# Patient Record
Sex: Female | Born: 1970 | Race: White | Hispanic: No | Marital: Married | Smoking: Never smoker
Health system: Southern US, Community
[De-identification: ages and names within clinical notes are randomized; demographics above are authoritative.]

## PROBLEM LIST (undated history)

## (undated) DIAGNOSIS — N200 Calculus of kidney: Secondary | ICD-10-CM

## (undated) HISTORY — PX: ABDOMINAL HYSTERECTOMY: SHX81

## (undated) HISTORY — PX: MASTECTOMY: SHX3

---

## 2007-01-12 ENCOUNTER — Inpatient Hospital Stay (HOSPITAL_COMMUNITY): Admission: AD | Admit: 2007-01-12 | Discharge: 2007-01-12 | Payer: Self-pay | Admitting: Obstetrics and Gynecology

## 2007-01-13 ENCOUNTER — Inpatient Hospital Stay (HOSPITAL_COMMUNITY): Admission: AD | Admit: 2007-01-13 | Discharge: 2007-01-13 | Payer: Self-pay | Admitting: *Deleted

## 2007-02-08 ENCOUNTER — Encounter (INDEPENDENT_AMBULATORY_CARE_PROVIDER_SITE_OTHER): Payer: Self-pay | Admitting: Obstetrics and Gynecology

## 2007-02-08 ENCOUNTER — Inpatient Hospital Stay (HOSPITAL_COMMUNITY): Admission: AD | Admit: 2007-02-08 | Discharge: 2007-02-13 | Payer: Self-pay | Admitting: Obstetrics and Gynecology

## 2007-02-10 ENCOUNTER — Encounter: Payer: Self-pay | Admitting: Obstetrics and Gynecology

## 2007-02-14 ENCOUNTER — Encounter: Admission: RE | Admit: 2007-02-14 | Discharge: 2007-02-19 | Payer: Self-pay | Admitting: Obstetrics and Gynecology

## 2008-08-15 ENCOUNTER — Encounter: Admission: RE | Admit: 2008-08-15 | Discharge: 2008-08-15 | Payer: Self-pay | Admitting: Obstetrics and Gynecology

## 2009-08-21 ENCOUNTER — Encounter: Admission: RE | Admit: 2009-08-21 | Discharge: 2009-08-21 | Payer: Self-pay | Admitting: Obstetrics and Gynecology

## 2010-08-11 ENCOUNTER — Other Ambulatory Visit: Payer: Self-pay | Admitting: Obstetrics and Gynecology

## 2010-08-11 DIAGNOSIS — Z0000 Encounter for general adult medical examination without abnormal findings: Secondary | ICD-10-CM

## 2010-08-22 ENCOUNTER — Ambulatory Visit
Admission: RE | Admit: 2010-08-22 | Discharge: 2010-08-22 | Disposition: A | Discharge status: Home / Self Care | Payer: Managed Care, Other (non HMO) | Source: Ambulatory Visit | Attending: Obstetrics and Gynecology | Admitting: Obstetrics and Gynecology

## 2010-08-22 DIAGNOSIS — Z0000 Encounter for general adult medical examination without abnormal findings: Secondary | ICD-10-CM

## 2010-12-04 NOTE — Op Note (Signed)
NAME:  Aimee Mcdaniel, Aimee Mcdaniel               ACCOUNT NO.:  [id number]   MEDICAL RECORD NO.:  [id number]          PATIENT TYPE:  INP   LOCATION:  9372                          FACILITY:  [facility]   PHYSICIAN:  Lenoard Aden, M.D.DATE OF BIRTH:  May 18, 1971   DATE OF PROCEDURE:  02/08/2007  DATE OF DISCHARGE:                               OPERATIVE REPORT   PREOPERATIVE DIAGNOSIS:  Twin B breech with foot and cord presenting.   POSTOPERATIVE DIAGNOSIS:  Twin B breech with foot and cord presenting.   PROCEDURES:  1. Urgent primary low segment transverse cesarean section.  2. Failed external cephalic version.   SURGEON:  Lenoard Aden, M.D.   ASSISTANT:  Marlinda Mike, C.N.M.   ANESTHESIA:  Epidural by Raul Del, M.D.   ESTIMATED BLOOD LOSS:  500 mL.   COMPLICATIONS:  None.   DRAINS:  Foley.   COUNTS:  Correct.   Patient to recovery in good condition.   BRIEF OPERATIVE NOTE:  After uncomplicated vacuum-assisted delivery of  twin A, it is noted that twin B is presenting with feet cord.  External  cephalic version is attempted x2 with forward roll and backward roll  done under ultrasound guidance without success.  Fetal heart tones  remaining in the 100-120 beat per minute range during the entire  process.  After two attempts at external cephalic version, the decision  was made to proceed with primary C-section due to foot and cord  presenting.  At this time the patient was brought to the operating  where, she is administered dosing of epidural anesthetic without  complication prepped and draped in the usual sterile fashion.  A Foley  catheter had been previously placed.  At this time a Pfannenstiel skin  incision is then made with a scalpel, carried down to the fascia, which  was nicked in the midline and opened transversely using Mayo scissors.  Rectus muscles dissected sharply in the midline, peritoneum entered  sharply.  Bladder blade placed.  Visceral peritoneum scored  sharply off  the lower uterine segment.  Kerr hysterotomy incision made.  Atraumatic  delivery of twin B from a footling breech position.  Was handed to  pediatricians in attendance.  Apgars pending.  Cord pH 7.04.  Placenta  was delivered manually intact, three-vessel cords are noted.  The  placenta is sent to pathology for confirmation of diochorionicity.  At  this time the uterus is curetted using a dry lap pack and closed in two  running, imbricating  layers of 0 Monocryl suture.  Good hemostasis is noted.  Bladder flap  inspected and found to be hemostatic.  Irrigation is accomplished.  Fascia closed using 0 Monocryl in continuous running fashion.  Skin  closed using staples.  The patient tolerates the procedure well and is  transferred to recovery in good condition.      Lenoard Aden, M.D.  Electronically Signed     RJT/MEDQ  D:  02/08/2007  T:  02/08/2007  Job:  [id number]

## 2010-12-04 NOTE — Op Note (Signed)
NAME:  Aimee Mcdaniel, Aimee Mcdaniel               ACCOUNT NO.:  [id number]   MEDICAL RECORD NO.:  [id number]          PATIENT TYPE:  INP   LOCATION:  9372                          [facility]ACILITY:  [facility]   PHYSICIAN:  Lenoard Aden, M.D.DATE OF BIRTH:  [birth year]   DATE OF PROCEDURE:  02/08/2007  DATE OF DISCHARGE:                               OPERATIVE REPORT   PREOPERATIVE DIAGNOSIS:  Nonreassuring fetal heart rate tracing Twin A.   POSTOPERATIVE DIAGNOSIS:  Nonreassuring fetal heart rate tracing Twin A.   PROCEDURE:  Outlet vacuum-assisted vaginal delivery with Kiwi cup.   SURGEON:  Taavon.   ANESTHESIA:  Epidural.   ESTIMATED BLOOD LOSS:  Less than 200 mL.   COMPLICATIONS:  None.   DRAINS:  Foley.   BRIEF OPERATIVE NOTE:  After being apprised of the risks and benefits of  vacuum assistance, including a small incidence of septal hematoma, scalp  laceration and intracranial hemorrhage, the patient Kiwi cup is placed,  fetal vertex OA, +3 station for two pulls over intact perineum a full-  term living female, Apgars 8 and 9, cord clamped.  No perineal laceration  noted, cervix intact.  Bulging bag of water noted for Twin B.   ESTIMATED BLOOD LOSS:  Less than 200 mL.   Separate note to be dictated for Twin B's delivery.      Lenoard Aden, M.D.  Electronically Signed     RJT/MEDQ  D:  02/08/2007  T:  02/08/2007  Job:  [id number]

## 2010-12-04 NOTE — Consult Note (Signed)
NAME:  Aimee Mcdaniel, Aimee Mcdaniel NO.:  [id number]   MEDICAL RECORD NO.:  [id number]          PATIENT TYPE:  OUT   LOCATION:  MRI                          FACILITY:  [facility]   PHYSICIAN:  Deanna Artis. Hickling, M.D.DATE OF BIRTH:  April 16, 1971   DATE OF CONSULTATION:  02/10/2007  DATE OF DISCHARGE:                                 CONSULTATION   CHIEF COMPLAINT:  Visual misperceptions and alteration with  hypertension.   I was asked to evaluate Aimee Mcdaniel.  She is a 40 year old gravida 4,  para 1-3-0-4 woman who delivered twin children,  boy and girl February 14, 2007.   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  Peak creatinine occurred on June 20 at 2205 and was 2.44.  It is since dropped to 1.99 as of this morning at 5:05.  The patient's  BUN was of 27.  Sodium slightly low 131.  The rest of the electrolytes  were normal and glucose was low at 5.3, calcium low at 7.55.  I presumed  that her albumin is similarly low (1.7).  Transaminase is at and 5:15  yesterday morning showed an AST of 234, ALT of 178.  The patient's  glomerular filtration rate was less than 60.  She was treated with  magnesium sulfate but developed elevated magnesium level up to 5.7 in  part because her renal status.  Uric acid was also elevated at 9.1.   The patient has had elevated white count of 18,600, normal platelets and  normal hemoglobin and MCV.   The patient has had in addition to her abnormal liver and renal  functions has had visual changes where she feels as if she is seeing  ants crawling around, she is seeing double or triple of her of objects  such as her husband's head. She seeing hair on things and looked at her  sheets and said it looked like lava was flowing.   The patient has not had headache.  Other than the visual changes, she  has been lucid and she realizes that these visual changes are not real.  She has had problems with unsteady gait and  blurred vision and problems  with memory.  In the setting, I was asked to see her.  We performed a CT  scan of the brain which was unremarkable.  We intended to perform an MRI  scan of the brain without and with contrast but could not do that  because the patient has staples from her C-section and also her  glomerular filtration rate is such that she could not have received  contrast.  I had intended to perform MRI scan of the brain without and  with contrast and an MRP to rule out venous sinus thrombosis.   PAST MEDICAL HISTORY:  The patient gained about 31-1/2 pounds up to February 02, 2007.  Twins were discovered with the first ultrasound.  The  patient's blood pressure was normal in the 100-110 over 60-70 range  until the time of delivery when heart rate shot up.  I believe that the  twins were delivered at [redacted] weeks gestational age.  The patient had no  other significant medical problems and has not had previous problems  with her pregnancies.  Her first baby was [redacted] weeks gestational age, the  twins were [redacted] weeks gestational age, the second pregnancy was [redacted] weeks  gestational age.   There is no family history of seizures, mental retardation, blindness,  deafness, birth defects.  The patient's mother is taking no medications  at this time and has no known allergies to medicines.   REVIEW OF SYSTEMS:  Is as noted above. In addition she has had problems  with gastroesophageal reflux disease, constipation.  She wears contacts.  She says that she has a spot in her eyes which I was not able to see on  funduscopic examination.  She had prior vaginal deliveries in 1998 and  2001, and had a cesarean delivery for the twins.   The twins were conceived by in vitro fertilization.  Mother is O+,  antibody negative, rubella immune, hepatitis surface antigen, VDRL and  GC, chlamydia, group B strep negative.   PHYSICAL EXAMINATION:  GENERAL:  On examination today, pleasant well-  developed woman no  acute distress.  VITAL SIGNS:  Blood pressure 149/98, resting pulse 80, respirations 18,  temperature 99.4, oxygen saturation 97% on room air.  HEENT:  Supple neck.  No infections.  No bruits.  LUNGS:  Clear.  HEART:  No murmurs.  Pulses normal.  ABDOMEN:  Soft.  She is post gravid protuberant, tender.  Bowel sounds  are normal.  EXTREMITIES:  Were normal except for pitting edema in the ankles.  NEUROLOGY:  Awake, alert.  She is not having hallucinations rather she  is having visual misperceptions and distortions.  She is aware that  these were not real.  CRANIAL NERVES:  Round reactive pupils.  Visual fields full to double  simultaneous stimuli.  Fundi showed irregular disk margins which were  sharp, normal vessels.  No exudates or hemorrhages. She had normal  macular regions bilaterally.  Motor examination; normal strength, tone,  and mass.  Good fine motor movements.  No pronator drift.  Sensation is  intact cold, vibration, proprioception, stereognosis.  Cerebellar  examination good finger-to-nose, rapid repetitive movements.  Gait not  tested. Deep tendon reflexes were normal.  The patient had bilateral  flexor plantar responses.   IMPRESSION:  1. 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 (348.39)  2. Hypertension.  3. Visual dysfunction.   PLAN:  1. Labetalol 100 mg twice daily.  2. Discussed this with Doretha Sou, M.D. of NICU.  The amount      of labetalol that will get into breast milk is not enough to a      significantly effect the children.  We will monitor the patient's      liver and renal functions.  MRI scan is not possible until staples      comes out, MRV does not require contrast.  The patient cannot      receive Gadolinium until glomerular filtration rate (GFR) is      greater than 60.  I appreciate the opportunity to participate in      her care, and I believe that this will improve over days to  weeks.      If you have questions or I can be of assistance do not hesitate to      contact me.  Deanna Artis. Sharene Skeans, M.D.  Electronically Signed     [facility]/MEDQ  D:  02/10/2007  T:  02/11/2007  Job:  [id number]   cc:   Lenoard Aden, M.D.  Fax: [phone]

## 2010-12-07 NOTE — Discharge Summary (Signed)
NAME:  Aimee Mcdaniel, HEADEN               ACCOUNT NO.:  [id number]   MEDICAL RECORD NO.:  [id number]          PATIENT TYPE:  INP   LOCATION:  9108                          FACILITY:  [facility]   PHYSICIAN:  Lenoard Aden, M.D.DATE OF BIRTH:  April 14, 1971   DATE OF ADMISSION:  02/08/2007  DATE OF DISCHARGE:  02/13/2007                               DISCHARGE SUMMARY   Patient with an uncomplicated vaginal delivery followed by a primary C  section for twin intrauterine gestation. Postoperative course  complicated by preeclampsia with poor toleration to elevated blood  pressure. Neurology consult obtained. Started on medication as noted by  neurology. She is started on labetalol. She is discharged to home day 5.  Discharge teaching done. Followup in the office in 1 week.      Lenoard Aden, M.D.  Electronically Signed     RJT/MEDQ  D:  03/25/2007  T:  03/26/2007  Job:  [id number]

## 2011-05-02 ENCOUNTER — Ambulatory Visit: Payer: Managed Care, Other (non HMO) | Attending: Gynecologic Oncology | Admitting: Gynecologic Oncology

## 2011-05-02 DIAGNOSIS — Z803 Family history of malignant neoplasm of breast: Secondary | ICD-10-CM | POA: Insufficient documentation

## 2011-05-02 DIAGNOSIS — N9489 Other specified conditions associated with female genital organs and menstrual cycle: Secondary | ICD-10-CM | POA: Insufficient documentation

## 2011-05-03 NOTE — Consult Note (Signed)
NAME:  Aimee Mcdaniel, Aimee Mcdaniel NO.:  [id number]  MEDICAL RECORD NO.:  [id number]  LOCATION:  GYN                          FACILITY:  [facility]  PHYSICIAN:  Laurette Schimke, MD     DATE OF BIRTH:  [birth year]  DATE OF CONSULTATION:  05/02/2011 DATE OF DISCHARGE:                                CONSULTATION   REQUESTING PHYSICIAN:  Alphonsus Sias. Ernestina Penna, M.D.  REASON FOR VISIT:  Bilateral adnexal masses.  Consult requested by Dr. Noland Fordyce for evaluation of pelvic masses.  HISTORY OF PRESENT ILLNESS:  This is a 40 year old gravida 3, para 3, who in February 2012, noted severe abdominal pain when out of town on a winter vacation.  At that time, an ultrasound was performed and was consistent with a cyst, it was presumed that perhaps she had had an intermittent ovarian torsion.  An ultrasound was obtained in April 2012 and that ultrasound demonstrated a right ovary with 3 simple cysts, the largest measuring 3.3 cm.  The left ovary had the appearance of an endometrioma, measuring 2.5 cm.  Repeat ultrasound 6 months later in September, 2012 demonstrates an increase in the right ovarian size with a change in volume from 51 cc to 111 cc.  The adnexa now measures 5.5 cm in greatest dimension.  The left ovary is notable to have a stable 2.5 cm x 2 cm endometrioma.  A CA-125 was ordered and a value returned as 21.  Aimee Mcdaniel denies any dysmenorrhea, dyspareunia, abnormal uterine bleeding.  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  Her husband has since had a vasectomy.  PAST SURGICAL HISTORY:  Cesarean section.  FAMILY HISTORY:  Paternal grandmother with breast cancer diagnosed in her 50s.  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  She denies tobacco use and reports occasional social alcohol use.  REVIEW OF SYSTEMS:  No nausea, vomiting, fever, or chills.  No  abnormal uterine bleeding.  Dysmenorrhea.  No rectal bleeding or hematuria. Otherwise, 10-point review of systems is unremarkable.  PHYSICAL EXAMINATION:  GENERAL:  A well-developed female, in no acute distress. VITAL SIGNS:  Weight 160 pounds, height 5 feet 6 inches, blood pressure 100/58, pulse of 76. CHEST:  Clear to auscultation. LYMPH NODE SURVEY:  No cervical, supraclavicular, or inguinal adenopathy. ABDOMEN:  Soft and nontender without any masses. PELVIC:  Normal external genitalia, Bartholin's, urethra, and Skene. Cervix small.  Nabothian cysts identified.  Uterus is mobile, no nodularity noted within the cul-de-sac.  Good anal sphincter tone without any rectal masses.  IMPRESSION AND PLAN:  Aimee Mcdaniel is a 40 year old with a normal CA-125 and enlarging adnexal masses consistent with an endometrioma.  At this time, she is asymptomatic, but is concerned regarding the growth. I have recommended to Aimee Mcdaniel the following that Dr. Ernestina Penna proceed with robotic-assisted bilateral ovarian cystectomies with strong consideration for performing right salpingo-oophorectomy particularly in the ovary with 3 cysts increasing in size.  If there is evidence of significant endometriosis, that warrants removal of both ovaries.  Aimee Mcdaniel at that time would prefer that a hysterectomy be  obtained.  This, however, will be based on this discussion with Dr. Ernestina Penna and Aimee Mcdaniel.  Aimee Mcdaniel is aware if malignancy is easily evident at the time of surgery, Dr. Ernestina Penna would likely just perform a biopsy to confirm the diagnosis and she would be re-referred to our service for definitive surgical management.  A similar plan has been discussed in the event that final pathology is consistent with an occult cancer.  Aimee Mcdaniel agrees and wishes to proceed.  I have advised her that if it is her intent to have this procedure occurred before Thanksgiving, it would be more prudent to contact Dr.  Ernestina Penna sooner rather than later.  Thank you very much for allowing me to participate in the care of this patient.     Laurette Schimke, MD     WB/MEDQ  D:  05/02/2011  T:  05/03/2011  Job:  [id number]  cc:   Telford Nab, R.N. [address] [place], [place] [address]  Lendon Colonel, MD Fax: [phone]  Electronically Signed by Laurette Schimke MD on 05/03/2011 12:58:07 PM

## 2011-05-06 LAB — CBC
HCT: 33.5 — ABNORMAL LOW
HCT: 34.2 — ABNORMAL LOW
HCT: 39
HCT: 39.5
Hemoglobin: 11.3 — ABNORMAL LOW
Hemoglobin: 11.5 — ABNORMAL LOW
Hemoglobin: 13.1
MCHC: 32.9
MCHC: 33.1
MCHC: 33.4
MCHC: 34.3
MCV: 88.6
MCV: 90
MCV: 90.2
MCV: 90.2
Platelets: 197
Platelets: 227
Platelets: 230
Platelets: 282
RBC: 3.78 — ABNORMAL LOW
RBC: 3.79 — ABNORMAL LOW
RBC: 4.14
RDW: 14.5 — ABNORMAL HIGH
RDW: 15.5 — ABNORMAL HIGH
WBC: 12.4 — ABNORMAL HIGH
WBC: 16.2 — ABNORMAL HIGH
WBC: 18.6 — ABNORMAL HIGH

## 2011-05-06 LAB — COMPREHENSIVE METABOLIC PANEL
ALT: 178 — ABNORMAL HIGH
ALT: 71 — ABNORMAL HIGH
AST: 234 — ABNORMAL HIGH
AST: 472 — ABNORMAL HIGH
Albumin: 1.7 — ABNORMAL LOW
Albumin: 1.9 — ABNORMAL LOW
Albumin: 1.9 — ABNORMAL LOW
Albumin: 2.5 — ABNORMAL LOW
Alkaline Phosphatase: 282 — ABNORMAL HIGH
BUN: 17
BUN: 25 — ABNORMAL HIGH
BUN: 26 — ABNORMAL HIGH
CO2: 23
CO2: 24
Calcium: 10.2
Calcium: 8.2 — ABNORMAL LOW
Calcium: 8.5
Calcium: 9.1
Chloride: 104
Chloride: 104
Creatinine, Ser: 2 — ABNORMAL HIGH
Creatinine, Ser: 2.28 — ABNORMAL HIGH
GFR calc Af Amer: 30 — ABNORMAL LOW
GFR calc Af Amer: 34 — ABNORMAL LOW
GFR calc non Af Amer: 25 — ABNORMAL LOW
GFR calc non Af Amer: >60
Glucose, Bld: 119 — ABNORMAL HIGH
Glucose, Bld: 95
Potassium: 3.8
Potassium: 4.2
Sodium: 132 — ABNORMAL LOW
Sodium: 136
Total Bilirubin: 2.6 — ABNORMAL HIGH
Total Bilirubin: 4 — ABNORMAL HIGH
Total Protein: 5 — ABNORMAL LOW
Total Protein: 5.2 — ABNORMAL LOW
Total Protein: 6

## 2011-05-06 LAB — RAPID HIV SCREEN (WH-MAU): Rapid HIV Screen: NONREACTIVE

## 2011-05-06 LAB — BASIC METABOLIC PANEL
CO2: 23
CO2: 25
Calcium: 8.2 — ABNORMAL LOW
Calcium: 8.5
Chloride: 104
Chloride: 110
Creatinine, Ser: 1.99 — ABNORMAL HIGH
Creatinine, Ser: 2.44 — ABNORMAL HIGH
GFR calc Af Amer: 27 — ABNORMAL LOW
GFR calc Af Amer: 34 — ABNORMAL LOW
GFR calc Af Amer: >60
GFR calc non Af Amer: 22 — ABNORMAL LOW
Glucose, Bld: 64 — ABNORMAL LOW
Potassium: 4.5
Potassium: 4.8
Sodium: 131 — ABNORMAL LOW
Sodium: 139

## 2011-05-06 LAB — URIC ACID
Uric Acid, Serum: 9.1 — ABNORMAL HIGH
Uric Acid, Serum: 9.2 — ABNORMAL HIGH
Uric Acid, Serum: 9.2 — ABNORMAL HIGH
Uric Acid, Serum: 9.6 — ABNORMAL HIGH

## 2011-05-06 LAB — PROTIME-INR: Prothrombin Time: 14.7

## 2011-05-06 LAB — MAGNESIUM: Magnesium: 5.4 — ABNORMAL HIGH

## 2011-06-12 ENCOUNTER — Other Ambulatory Visit (HOSPITAL_COMMUNITY): Payer: Managed Care, Other (non HMO)

## 2011-06-20 ENCOUNTER — Ambulatory Visit (HOSPITAL_COMMUNITY)
Admission: RE | Admit: 2011-06-20 | Payer: Managed Care, Other (non HMO) | Source: Ambulatory Visit | Admitting: Obstetrics

## 2011-06-20 ENCOUNTER — Encounter (HOSPITAL_COMMUNITY): Admission: RE | Payer: Self-pay | Source: Ambulatory Visit

## 2011-06-20 SURGERY — ROBOTIC ASSISTED LAPAROSCOPIC OVARIAN CYSTECTOMY
Anesthesia: General | Laterality: Right

## 2011-07-24 ENCOUNTER — Other Ambulatory Visit: Payer: Self-pay | Admitting: Obstetrics

## 2011-07-24 DIAGNOSIS — Z1231 Encounter for screening mammogram for malignant neoplasm of breast: Secondary | ICD-10-CM

## 2011-08-26 ENCOUNTER — Ambulatory Visit
Admission: RE | Admit: 2011-08-26 | Discharge: 2011-08-26 | Disposition: A | Discharge status: Home / Self Care | Payer: Managed Care, Other (non HMO) | Source: Ambulatory Visit | Attending: Obstetrics | Admitting: Obstetrics

## 2011-08-26 ENCOUNTER — Ambulatory Visit: Payer: Managed Care, Other (non HMO)

## 2011-08-26 DIAGNOSIS — Z1231 Encounter for screening mammogram for malignant neoplasm of breast: Secondary | ICD-10-CM

## 2012-03-25 ENCOUNTER — Other Ambulatory Visit: Payer: Self-pay

## 2012-08-10 ENCOUNTER — Other Ambulatory Visit: Payer: Self-pay | Admitting: Obstetrics

## 2012-08-10 DIAGNOSIS — Z1231 Encounter for screening mammogram for malignant neoplasm of breast: Secondary | ICD-10-CM

## 2012-08-28 ENCOUNTER — Ambulatory Visit
Admission: RE | Admit: 2012-08-28 | Discharge: 2012-08-28 | Disposition: A | Discharge status: Home / Self Care | Payer: Managed Care, Other (non HMO) | Source: Ambulatory Visit | Attending: Obstetrics | Admitting: Obstetrics

## 2012-08-28 DIAGNOSIS — Z1231 Encounter for screening mammogram for malignant neoplasm of breast: Secondary | ICD-10-CM

## 2013-04-05 ENCOUNTER — Other Ambulatory Visit: Payer: Self-pay | Admitting: Obstetrics

## 2013-04-05 DIAGNOSIS — N644 Mastodynia: Secondary | ICD-10-CM

## 2013-04-19 ENCOUNTER — Other Ambulatory Visit: Payer: Self-pay | Admitting: Obstetrics

## 2013-04-19 ENCOUNTER — Ambulatory Visit
Admission: RE | Admit: 2013-04-19 | Discharge: 2013-04-19 | Disposition: A | Discharge status: Home / Self Care | Payer: Managed Care, Other (non HMO) | Source: Ambulatory Visit | Attending: Obstetrics | Admitting: Obstetrics

## 2013-04-19 DIAGNOSIS — N644 Mastodynia: Secondary | ICD-10-CM

## 2013-08-12 ENCOUNTER — Other Ambulatory Visit: Payer: Self-pay

## 2013-08-12 DIAGNOSIS — Z1231 Encounter for screening mammogram for malignant neoplasm of breast: Secondary | ICD-10-CM

## 2013-09-01 ENCOUNTER — Ambulatory Visit: Payer: Managed Care, Other (non HMO)

## 2013-09-03 ENCOUNTER — Ambulatory Visit: Payer: Managed Care, Other (non HMO)

## 2013-09-15 ENCOUNTER — Ambulatory Visit
Admission: RE | Admit: 2013-09-15 | Discharge: 2013-09-15 | Disposition: A | Discharge status: Home / Self Care | Payer: Managed Care, Other (non HMO) | Source: Ambulatory Visit

## 2013-09-15 DIAGNOSIS — Z1231 Encounter for screening mammogram for malignant neoplasm of breast: Secondary | ICD-10-CM

## 2014-08-02 ENCOUNTER — Other Ambulatory Visit: Payer: Self-pay

## 2014-08-02 DIAGNOSIS — Z1231 Encounter for screening mammogram for malignant neoplasm of breast: Secondary | ICD-10-CM

## 2014-09-19 ENCOUNTER — Ambulatory Visit
Admission: RE | Admit: 2014-09-19 | Discharge: 2014-09-19 | Disposition: A | Discharge status: Home / Self Care | Payer: 59 | Source: Ambulatory Visit

## 2014-09-19 ENCOUNTER — Ambulatory Visit: Payer: Managed Care, Other (non HMO)

## 2014-09-19 DIAGNOSIS — Z1231 Encounter for screening mammogram for malignant neoplasm of breast: Secondary | ICD-10-CM

## 2015-01-16 ENCOUNTER — Encounter: Payer: Self-pay | Admitting: Gynecologic Oncology

## 2015-01-16 ENCOUNTER — Other Ambulatory Visit: Payer: Self-pay | Admitting: Obstetrics

## 2015-01-16 ENCOUNTER — Ambulatory Visit: Payer: 59 | Attending: Gynecologic Oncology | Admitting: Gynecologic Oncology

## 2015-01-16 VITALS — BP 123/63 | HR 68 | Temp 98.0°F | Resp 22 | Wt 184.3 lb

## 2015-01-16 DIAGNOSIS — N8320 Unspecified ovarian cysts: Secondary | ICD-10-CM

## 2015-01-16 DIAGNOSIS — N83209 Unspecified ovarian cyst, unspecified side: Secondary | ICD-10-CM

## 2015-01-16 DIAGNOSIS — Z803 Family history of malignant neoplasm of breast: Secondary | ICD-10-CM

## 2015-01-16 NOTE — Patient Instructions (Signed)
[facility] will contact you with an Surgical appt.

## 2015-01-16 NOTE — Progress Notes (Signed)
Consult Note: Gyn-Onc  Consult was requested by Dr. Pamala Hurry for the evaluation of Aimee Mcdaniel 44 y.o. female with BRCA 1 deleterious mutation and mildly elevated CA 125 in association with bilateral ovarian cysts.  CC:  Chief Complaint  Patient presents with  . Ovarian Cyst    Assessment/Plan:  Ms. Aimee Mcdaniel  is a 44 y.o.  year old woman with a deleterious mutation in BRCA 1 and bilateral complex ovarian cysts and a mildly elevated CA 125.    I discussed with Aimee Mcdaniel that I believe these ovarian masses are most likely benign however given her underlying familial predisposition to ovarian cancer, in the setting of a limited CA-125, I recommend surgical removal, frozen section, and staging or debulking as appropriate. She will prefer to undergo surgery at [facility] and we have facilitated this for her. We have scheduled her for a robotic-assisted total hysterectomy, BSO, possible staging.  I had an extensive discussion with Aimee Mcdaniel regarding options for surgery if the mass is a benign. We discussed the role of hysterectomy in risk reduction for ovarian and fallopian tube cancer and discussed that this does not carry with it a defined improvement in outcomes. We discussed that it does increase surgical risk and recovery. I did discuss that it does simplify post operative hormone replacement therapy (with Estrogen only preparations). The patient appears to be at average risk for future endometrial cancers, and has only a remote history of cervical dysplasia. After hearing this counseling Aimee Mcdaniel has elected for hysterectomy at the time of her BSO regardless of ovarian pathology.  We also had a discussion regarding implications of finding malignancy the time of frozen section. We discussed surgical staging which would include omentectomy, lymph node dissection, and biopsies. We will obtain a preoperative CT scan of the abdomen and pelvis to better evaluate the upper abdomen given her BRCA1 mutation  status, elevated CA-125, and complex adnexal masses. The finding of upper abdominal disease would suggest ovarian cancer and would likely necessitate a laparotomy approach for surgery. We discussed surgical risk including  bleeding, infection, damage to internal organs (such as bladder,ureters, bowels), blood clot, reoperation and rehospitalization. We discussed anticipated length of stay and postoperative recovery.  HPI: Aimee Mcdaniel is a very pleasant 44 year old para 4 who is seen in consultation at the request of Dr. Pamala Hurry for bilateral complex ovarian cysts and a deleterious mutation in BRCA1. The patient has a history of having pelvic pain approximate 5 years ago which prompted her gynecologist to perform pelvic ultrasound. At this time ovarian cysts were identified bilaterally. She subsequently underwent annual surveillance with ultrasounds of the cysts, which she reports came and went, but overall remained somewhat stable. Her most recent ultrasound was performed this see her on 12/13/2014. It revealed a grossly normal appearing uterus with measurements of 10 x 6.2 x 4.7 cm, an endometrial thickness of 12.9 mm. The right ovary was enlarged with a complex cyst 2 measuring 3.9 x 3.5 x 4.3 cm, and a 1.9 x 1.6 x 1.8 cm cyst. Both of these appear to be either endometriomas or hemorrhagic cysts. There was no vascularity noted within the cysts. The left ovary contained a complex cyst with low level echoes throughout measuring 3.5 x 2.5 x 2.9 cm, and was also consistent with an endometrioma. No free fluid was identified.  A CA-125 was drawn on 12/29/2014 and was slightly elevated at 38.  While the patient was sitting in her OB/GYN's office she noticed a flyer advertising genetic testing for BRCA  deleterious mutations. She has a family history of a paternal grandmother with breast cancer, and reports knowing that on her father's side of the family there was the "cancer gene". Despite this family history  she has no other close relatives with a defined malignancy other than her paternal grandmother. She electively underwent brachy mutation testing by the [facility] [employer] [facility]. It revealed a deleterious mutation in BRCA1.  She is not yet seen the high risk clinic for breast care, however she does have an MRI of the breast scheduled for next month.  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  Her only prior abdominal surgery is a cesarean section performed emergently for the second twin. She has a history of cervical dysplasia approximately 15-20 years ago that was treated with cryotherapy. She has had close regular surveillance for cervical dysplasia since that time and has had no subsequent abnormal pap smears.  Current Meds:  Outpatient Encounter Prescriptions as of 01/16/2015  Medication Sig  . buPROPion (WELLBUTRIN SR) 100 MG 12 hr tablet TK 1 T PO BID  . Vitamin D, Ergocalciferol, (DRISDOL) 50000 UNITS CAPS capsule TK 1 C PO WEEKLY   No facility-administered encounter medications on file as of 01/16/2015.    Allergy: Not on File  Social Hx:   History   Social History  . Marital Status: Married    Spouse Name: N/A  . Number of Children: N/A  . Years of Education: N/A   Occupational History  . Not on file.   Social History Main Topics  . Smoking status: Never Smoker   . Smokeless tobacco: Not on file  . Alcohol Use: 1.8 oz/week    3 Glasses of wine per week     Comment: per week  . Drug Use: Not on file  . Sexual Activity: Yes   Other Topics Concern  . Not on file   Social History Narrative  . No narrative on file    Past Surgical Hx: History reviewed. No pertinent past surgical history.  Past Medical Hx: History reviewed. No pertinent past medical history.  Past Gynecological History:  See above, SVD x 3 and 1 c/s. Remote hx of  cervical cryo for dysplasia. BRCA 1 deleterious mutation. No LMP recorded.  Family Hx: History reviewed. No pertinent family history.  Review of Systems:  Constitutional  Feels well,   ENT Normal appearing ears and nares bilaterally Skin/Breast  No rash, sores, jaundice, itching, dryness Cardiovascular  No chest pain, shortness of breath, or edema  Pulmonary  No cough or wheeze.  Gastro Intestinal  No nausea, vomitting, or diarrhoea. No bright red blood per rectum, no abdominal pain, change in bowel movement, or constipation.  Genito Urinary  No frequency, urgency, dysuria, Musculo Skeletal  No myalgia, arthralgia, joint swelling or pain  Neurologic  No weakness, numbness, change in gait,  Psychology  No depression, anxiety, insomnia.   Vitals:  Blood pressure 123/63, pulse 68, temperature 98 F (36.7 C), temperature source Oral, resp. rate 22, weight 184 lb 4.8 oz (83.598 kg), SpO2 100 %.  Physical Exam: WD in NAD Neck  Supple NROM, without any enlargements.  Lymph Node Survey No cervical supraclavicular or inguinal adenopathy Cardiovascular  Pulse normal rate, regularity and rhythm. S1 and S2 normal.  Lungs  Clear to auscultation bilateraly, without wheezes/crackles/rhonchi. Good air movement.  Skin  No rash/lesions/breakdown  Psychiatry  Alert and oriented to person, place, and time  Abdomen  Normoactive bowel sounds, abdomen soft, non-tender and overweight without evidence of hernia.  Back No CVA tenderness Genito Urinary  Vulva/vagina: Normal external female genitalia.  No lesions. No discharge or bleeding.  Bladder/urethra:  No lesions or masses, well supported bladder  Vagina: normal  Cervix: Normal appearing, no lesions.  Uterus: Small, mobile, no parametrial involvement or nodularity.  Adnexa: no palpable masses. Rectal  Good tone, no masses no cul de sac nodularity.  Extremities  No bilateral cyanosis, clubbing or edema.   Donaciano Eva,  MD   01/16/2015, 1:05 PM

## 2015-01-19 ENCOUNTER — Ambulatory Visit (HOSPITAL_COMMUNITY)
Admission: RE | Admit: 2015-01-19 | Discharge: 2015-01-19 | Disposition: A | Discharge status: Home / Self Care | Payer: Managed Care, Other (non HMO) | Source: Ambulatory Visit | Attending: Gynecologic Oncology | Admitting: Gynecologic Oncology

## 2015-01-19 ENCOUNTER — Ambulatory Visit (HOSPITAL_COMMUNITY): Payer: 59

## 2015-01-19 DIAGNOSIS — R971 Elevated cancer antigen 125 [CA 125]: Secondary | ICD-10-CM | POA: Diagnosis not present

## 2015-01-19 DIAGNOSIS — N8320 Unspecified ovarian cysts: Secondary | ICD-10-CM | POA: Diagnosis present

## 2015-01-19 DIAGNOSIS — N83209 Unspecified ovarian cyst, unspecified side: Secondary | ICD-10-CM

## 2015-01-19 MED ORDER — IOHEXOL 300 MG/ML  SOLN
100.0000 mL | Freq: Once | INTRAMUSCULAR | Status: AC | PRN
  Administered 2015-01-19: 100 mL via INTRAVENOUS

## 2015-01-20 ENCOUNTER — Telehealth: Payer: Self-pay | Admitting: Gynecologic Oncology

## 2015-01-20 NOTE — Telephone Encounter (Signed)
Patient informed of CT findings.  No concerns voiced.  Advised to call for any questions or concerns.

## 2015-01-20 NOTE — Telephone Encounter (Signed)
-----   Message from Adolphus BirchwoodEmma Rossi, MD sent at 01/20/2015  9:56 AM EDT ----- Can we let Aimee CalicoFrances know that the CT did not show anything more concerning. This may still be benign ovarian cysts. No change to current plan. Kara MeadEmma

## 2015-01-24 ENCOUNTER — Telehealth: Payer: Self-pay | Admitting: Nurse Practitioner

## 2015-01-24 DIAGNOSIS — N9489 Other specified conditions associated with female genital organs and menstrual cycle: Secondary | ICD-10-CM | POA: Insufficient documentation

## 2015-01-24 DIAGNOSIS — N83209 Unspecified ovarian cyst, unspecified side: Secondary | ICD-10-CM | POA: Insufficient documentation

## 2015-01-24 NOTE — Telephone Encounter (Signed)
[facility] gyn onc team calling to request most recent MD note. Dr. Oliver Humossi's progress note 01/16/15 faxed to [phone] per request.

## 2015-02-10 ENCOUNTER — Inpatient Hospital Stay: Admission: RE | Admit: 2015-02-10 | Payer: 59 | Source: Ambulatory Visit

## 2015-02-11 ENCOUNTER — Ambulatory Visit
Admission: RE | Admit: 2015-02-11 | Discharge: 2015-02-11 | Disposition: A | Discharge status: Home / Self Care | Payer: 59 | Source: Ambulatory Visit | Attending: Obstetrics | Admitting: Obstetrics

## 2015-02-11 MED ORDER — GADOBENATE DIMEGLUMINE 529 MG/ML IV SOLN
17.0000 mL | Freq: Once | INTRAVENOUS | Status: AC | PRN
  Administered 2015-02-11: 17 mL via INTRAVENOUS

## 2015-02-28 ENCOUNTER — Other Ambulatory Visit: Payer: Self-pay | Admitting: Obstetrics and Gynecology

## 2015-02-28 DIAGNOSIS — R928 Other abnormal and inconclusive findings on diagnostic imaging of breast: Secondary | ICD-10-CM

## 2015-04-17 ENCOUNTER — Ambulatory Visit: Payer: Self-pay | Admitting: Surgery

## 2015-04-17 NOTE — H&P (Signed)
Aimee Mcdaniel 04/17/2015 9:16 AM Location: [facility] Patient #: [id number] DOB: [birth year] Married / Language: Aimee Mcdaniel / Race: White Female History of Present Illness Aimee Mcdaniel A. Micael Barb MD; 04/17/2015 12:19 PM) Patient words: BRCA + Pt sent at the request of Dr Migdalia Aimee Mcdaniel for positive BRCA test and desire for bilateral mastectomy and reconstruction. She purchased her own test and was positive. She has had BSO /HYSTERCTOMY and desires bilateral mastectomy for further risk reduction. 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 She has no other complaints.                      CLINICAL DATA: 44 year old female with newly diagnosed BRCA 1 mutation. The patient had a negative screening mammogram in February 2016.  LABS: No labs were performed at the imaging center today.  EXAM: BILATERAL BREAST MRI WITH AND WITHOUT CONTRAST  TECHNIQUE: Multiplanar, multisequence MR images of both breasts were obtained prior to and following the intravenous administration of 17 ml of MultiHance.  THREE-DIMENSIONAL MR IMAGE RENDERING ON INDEPENDENT WORKSTATION:  Three-dimensional MR images were rendered by post-processing of the original MR data on an independent workstation. The three-dimensional MR images were interpreted, and findings are reported in the following complete MRI report for this study. Three dimensional images were evaluated at the independent DynaCad workstation  COMPARISON: Prior mammograms, the most recent dated 09/19/2014  FINDINGS: Breast composition: c. Heterogeneous fibroglandular tissue.  Background parenchymal enhancement: Mild  Right breast: Within the upper, outer right breast, there is an irregular enhancing mass measuring 1.1 (AP) x 1 (TR) x 0.7 (CC) cm (series 7, image 43). Multiple patchy areas of non mass enhancement are noted within the posterior right breast.  Left breast: Within the upper,  outer left breast, there is a large area of asymmetric non mass enhancement spanning 5.4 (AP) x 2.2 (TR) x 4.7 (CC) cm. There is an additional smaller area of asymmetric non mass enhancement within the lower, outer left breast (see series 7, image 100).  Lymph nodes: No abnormal appearing lymph nodes.  IMPRESSION: 1. Indeterminate irregular enhancing mass within the upper, outer right breast. 2. Indeterminate asymmetric area of non mass enhancement within the upper, outer left breast. Additional smaller area of similar appearing non mass enhancement within the lower, outer left breast.  RECOMMENDATION: MRI guided biopsies of the irregular mass within the upper, outer right breast and the area of asymmetric non mass enhancement within the upper, outer left breast. If biopsy results demonstrate atypia or malignancy, MRI guided biopsy of the additional area of asymmetric non mass enhancement within the lower, outer left breast is recommended. If biopsy results are benign, six-month follow-up breast MRI is recommended.  BI-RADS CATEGORY 4: Suspicious.   Electronically Signed By: Pamelia Hoit M.D. On: 02/13/2015 13:42.  The patient is a 44 year old female   Other Problems Aimee Mcdaniel, [place]; 04/17/2015 9:17 AM) Cancer Other disease, cancer, significant illness  Past Surgical History Aimee Mcdaniel, [place]; 04/17/2015 9:17 AM) Breast Biopsy Bilateral. Cesarean Section - 1 Hysterectomy (not due to cancer) - Complete  Diagnostic Studies History Aimee Mcdaniel, [place]; 04/17/2015 9:17 AM) Colonoscopy 5-10 years ago Mammogram within last year Pap Smear 1-5 years ago  Allergies Aimee Mcdaniel, CMA; 04/17/2015 9:17 AM) No Known Drug Allergies 04/17/2015  Medication History Aimee Mcdaniel, CMA; 04/17/2015 9:18 AM) Vitamin D (50000U Tablet, Oral) Active. Medications Reconciled  Social History Aimee Mcdaniel,  CMA; 04/17/2015 9:17 AM) Alcohol use  Moderate alcohol use. Caffeine use Coffee. No drug use Tobacco use Never smoker.  Family History Aimee Mcdaniel, [place]; 04/17/2015 9:17 AM) Heart disease in female family member before age 42  Pregnancy / Birth History Aimee Mcdaniel, [place]; 04/17/2015 9:17 AM) Age at menarche 59 years. Age of menopause <43 Gravida 3 Maternal age 77-35 Para 4 Regular periods     Review of Systems Aimee Mcdaniel CMA; 04/17/2015 9:17 AM) General Present- Night Sweats and Weight Gain. Not Present- Appetite Loss, Chills, Fatigue, Fever and Weight Loss. Skin Not Present- Change in Wart/Mole, Dryness, Hives, Jaundice, New Lesions, Non-Healing Wounds, Rash and Ulcer. HEENT Not Present- Earache, Hearing Loss, Hoarseness, Nose Bleed, Oral Ulcers, Ringing in the Ears, Seasonal Allergies, Sinus Pain, Sore Throat, Visual Disturbances, Wears glasses/contact lenses and Yellow Eyes. Respiratory Not Present- Bloody sputum, Chronic Cough, Difficulty Breathing, Snoring and Wheezing. Breast Not Present- Breast Mass, Breast Pain, Nipple Discharge and Skin Changes. Cardiovascular Not Present- Chest Pain, Difficulty Breathing Lying Down, Leg Cramps, Palpitations, Rapid Heart Rate, Shortness of Breath and Swelling of Extremities. Gastrointestinal Present- Change in Bowel Habits and Excessive gas. Not Present- Abdominal Pain, Bloating, Bloody Stool, Chronic diarrhea, Constipation, Difficulty Swallowing, Gets full quickly at meals, Hemorrhoids, Indigestion, Nausea, Rectal Pain and Vomiting. Female Genitourinary Not Present- Frequency, Nocturia, Painful Urination, Pelvic Pain and Urgency. Musculoskeletal Not Present- Back Pain, Joint Pain, Joint Stiffness, Muscle Pain, Muscle Weakness and Swelling of Extremities. Neurological Not Present- Decreased Memory, Fainting, Headaches, Numbness, Seizures, Tingling, Tremor, Trouble walking and Weakness. Psychiatric Not Present- Anxiety, Bipolar, Change in Sleep Pattern,  Depression, Fearful and Frequent crying. Endocrine Present- Hot flashes. Not Present- Cold Intolerance, Excessive Hunger, Hair Changes, Heat Intolerance and New Diabetes. Hematology Not Present- Easy Bruising, Excessive bleeding, Gland problems, HIV and Persistent Infections.  Vitals [employer] R. Mcdaniel CMA; 04/17/2015 9:17 AM) 04/17/2015 9:16 AM Weight: 191.5 lb Height: 66.5in Body Surface Area: 2.02 m Body Mass Index: 30.45 kg/m BP: 124/82 (Sitting, Left Arm, Standard)     Physical Exam (Jaspal Pultz A. Nekeisha Aure MD; 04/17/2015 12:20 PM)  General Mental Status-Alert. General Appearance-Consistent with stated age. Hydration-Well hydrated. Voice-Normal.  Head and Neck Head-normocephalic, atraumatic with no lesions or palpable masses. Trachea-midline. Thyroid Gland Characteristics - normal size and consistency.  Eye Eyeball - Bilateral-Extraocular movements intact. Sclera/Conjunctiva - Bilateral-No scleral icterus.  Chest and Lung Exam Chest and lung exam reveals -quiet, even and easy respiratory effort with no use of accessory muscles and on auscultation, normal breath sounds, no adventitious sounds and normal vocal resonance. Inspection Chest Wall - Normal. Back - normal.  Breast Note: large ptotic breasts no masses left larger than right   Cardiovascular Cardiovascular examination reveals -normal heart sounds, regular rate and rhythm with no murmurs and normal pedal pulses bilaterally.  Neurologic Neurologic evaluation reveals -alert and oriented x 3 with no impairment of recent or remote memory. Mental Status-Normal.  Musculoskeletal Normal Exam - Left-Upper Extremity Strength Normal and Lower Extremity Strength Normal. Normal Exam - Right-Upper Extremity Strength Normal and Lower Extremity Strength Normal.  Lymphatic Head & Neck  General Head & Neck Lymphatics: Bilateral - Description - Normal. Axillary  General Axillary Region:  Bilateral - Description - Normal. Tenderness - Non Tender.    Assessment & Plan (Amanee Iacovelli A. Kailena Lubas MD; 04/17/2015 12:21 PM)  BRCA1 POSITIVE (Z15.01) Impression: PT DESIRES BILATERAL SIMPLE MASTECTOMY FOR RISK REDUCTION. SHE IS NOT A CANDIDATE FOR NSM. Discussed treatment options for BRCA 1 OF bilateral mastectomy with  reconstruction. Pt has decided on mastectomy. Risk include bleeding, infection, flap necrosis, pain, numbness, recurrence, hematoma, other surgery needs. Pt understands and agrees to proceed. Dr Migdalia Aimee Mcdaniel to coordinate reconstruction.  Current Plans Pt Education - Overview of hereditary breast and ovarian cancer syndromes: discussed with patient and provided information. Pt Education - BRCA1 and BRCA2-associated hereditary breast and ovarian cancer: discussed with patient and provided information. Pt Education - CCS Mastectomy HCI   The anatomy and the physiology was discussed. The pathophysiology and natural history of the disease was discussed. Options were discussed and recommendations were made. Technique, risks, benefits, & alternatives were discussed. Risks such as stroke, heart attack, bleeding, indection, death, and other risks discussed. Questions answered. The patient agrees to proceed.

## 2015-04-27 ENCOUNTER — Telehealth: Payer: Self-pay | Admitting: Genetic Counselor

## 2015-04-27 NOTE — Telephone Encounter (Signed)
genetic appt-s/w patient and gave genetic appt for 10/011 @ 11 w/Kayla Boggs.

## 2015-05-02 ENCOUNTER — Ambulatory Visit (HOSPITAL_BASED_OUTPATIENT_CLINIC_OR_DEPARTMENT_OTHER): Payer: Managed Care, Other (non HMO) | Admitting: Genetic Counselor

## 2015-05-02 ENCOUNTER — Encounter: Payer: Self-pay | Admitting: Genetic Counselor

## 2015-05-02 ENCOUNTER — Other Ambulatory Visit: Payer: 59

## 2015-05-02 DIAGNOSIS — Z1501 Genetic susceptibility to malignant neoplasm of breast: Secondary | ICD-10-CM

## 2015-05-02 DIAGNOSIS — Z315 Encounter for genetic counseling: Secondary | ICD-10-CM

## 2015-05-02 DIAGNOSIS — Z803 Family history of malignant neoplasm of breast: Secondary | ICD-10-CM | POA: Diagnosis not present

## 2015-05-02 DIAGNOSIS — Z1379 Encounter for other screening for genetic and chromosomal anomalies: Secondary | ICD-10-CM

## 2015-05-02 DIAGNOSIS — Z1509 Genetic susceptibility to other malignant neoplasm: Principal | ICD-10-CM

## 2015-05-02 DIAGNOSIS — Z8041 Family history of malignant neoplasm of ovary: Secondary | ICD-10-CM | POA: Diagnosis not present

## 2015-05-02 NOTE — Progress Notes (Signed)
GENETIC TEST RESULTS   Patient Name: Aimee Mcdaniel Patient Age: 44 y.o. Encounter Date: 05/02/2015  Referring Provider: Aloha Gell, MD   Aimee Mcdaniel had previous positive BRCA1 genetic testing through [facility], [place]), at which time she had a 19-minute post-results counseling session with one of the [employer] genetic counselors via telephone.  She comes to [facility] today with her husband prior to a scheduled prophylactic bilateral mastectomies procedure scheduled for the end of the month at [facility], to have some additional questions and concerns addressed.      GENETIC TESTING:  Aimee Mcdaniel has no personal history of cancer.  She has a family history of breast cancer in her paternal grandmother that was diagnosed at the age of 40 or younger.  There is also a maternal family history of prostate cancer in her maternal grandfather, diagnosed at 30 and a history of ovarian cancer diagnosed in her maternal great grandmother (MGF's mother).    While she had no overwhelming family history of cancer, Aimee Mcdaniel reports that she was curious to know more about her own potential genetic cancer risks.  She cites this as the primary reason for her pursuing genetic testing.  However, she also recalled working with a distant relative who, at one time, informed her that their family--the Henrene Pastor family--"carried the BRCA gene".  She had a difficult time getting genetic testing covered by insurance and eventually had genetic testing through [facility], and this was ordered by Dr. Pamala Hurry.  This test was the 24-gene Inherited Cancer Screen panel, which included sequencing and/or deletion/duplication analysis of the APC, ATM, BMPR1A, BRCA1, BRCA2, CDH1, CHEK2, EPCAM, MEN1, MLH1, MSH2, MSH6, PALB2, PMS2, PTEN, RET, SDHA, SDHB, SDHC, SMAD4, STK11, TP53, and VHL genes.  Testing revealed a mutation in one copy of the BRCA1 gene called "[id number]".  No  additional pathogenic mutations were found.  Note that variant of uncertain significance (VUSs), if any, were not reported on this test report.  Date of report is December 27, 2014.    Aimee Mcdaniel reports that she has since spoken with one of her distant Henrene Pastor cousins.  This cousin took a picture of her previous positive genetic test result, and this showed that she was found to have the same BRCA1 mutation.  Thus, this mutation must have been inherited from Aimee Mcdaniel's father, which he must have inherited from his own father.   MEDICAL MANAGEMENT: Aimee Mcdaniel was very aware that women who have a BRCA mutation have an increased risk for both breast and ovarian cancer.  She underwent a TAH-BSO in August 2016 and plans to [facility] a prophylactic double mastectomy procedure at the end of the month at [facility].  She is aware of the screening recommendations for BRCA1 mutation carriers, as listed below:  Breast Cancer:  - Starting at age 44: Breast awareness - Women should be familiar with their breasts and promptly report changes to their healthcare provider. Performing regular breast self exams may help increase breast awareness, especially when checked at the end of the menstrual cycle.  - Starting at age 3: Clinical breast exam every 6-12 months. - Between ages 25-29 or individualized based on family history: Breast MRI screening (preferred) every year or mammogram if MRI is unavailable. - Between ages 31-75: Mammogram and breast MRI screening every year.  - Option of risk-reducing bilateral mastectomies  Ovarian Cancer: - Recommend risk-reducing salpingo-oophorectomy (RRSO), typically between 60 and 34 y, and upon completion of  child bearing.   - While there may be circumstances where ovarian cancer screening with transvaginal ultrasound and a blood test for a protein called CA-125 are helpful, these techniques have not been shown to be effective in detecting early ovarian cancer and are generally not  recommended.  - Consider risk reduction agents as options for breast and ovarian cancer, including discussing risks and benefits.  Other Cancers: Men with BRCA1 mutations also need to be aware of their breast and prostate cancer risks and screening recommendatons.  We discussed that while literature has shown other cancers to be associated with BRCA1 mutations, national guidelines do not currently recommend any specific screenings for these cancers. Screening may be individualized based on cancers observed the family  TODAY'S CONCERNS: While we did discuss that prophylactic bilateral mastectomies can reduce breast cancer risk by over 90%, Aimee Mcdaniel was already very well-read regarding BRCA1 cancer risks and screening recommendations.  Her main concerns today centered around ensuring that this was in fact a positive test result (and not a false positive) and about how she should go about telling her children, in particular her oldest daughter who will be 42 (and thus eligible for testing) soon.    We discussed that false positive results are rare, that the lab takes additional control measures to ensure that these results do not occur, that the family history of this same mutation makes a false positive even more unlikely, and we also double-checked that this is a pathogenic result through use of the [employer].  The ClinVar database demonstrated approximately 9 additional entries in which this was called a pathogenic mutation.    We discussed that Aimee Mcdaniel's daughters should definitely be informed of their 50% chance for having inherited this same mutation by the age of 80, since that is when they will begin clinical management.  Beyond that, we discussed that she and her husband are the best individuals to determine when the best time to disclose this information is.  I also presented Aimee Mcdaniel with a nice booklet from the [employer] which discusses how to talk about BRCA in your  family tree with children who are at risk.    FAMILY MEMBERS: We discussed that is important that all of Ms. Render's relatives (both men and women) know of the presence of this gene mutation. Ms. Digilio brother should also be informed of his 50% chance for having inherited this same mutation.  It is also important to attempt to reach out to more distant paternal relatives.  Site-specific genetic testing can sort out who in the family is at risk and who is not.   SUPPORT AND RESOURCES: If Ms. Rane is interested in BRCA-specific information and support, there are two groups, Facing Our Risk of Cancer Empowered (FORCE) (www.facingourrisk.com) and Bright Pink (www.brightpink.org) which some people have found useful. They provide opportunities to speak with other individuals from high-risk families. To locate genetic counselors in other cities, visit the website of the [employer] (ArtistMovie.se) and Secretary/administrator for a Social worker by zip code.  Additionally we discussed the interest meeting for a Hereditary Cancer Support Group we will be holding here at [facility] on November 7th at 6 PM.    We encouraged Ms. Haidar to remain in contact with [place] on an annual basis so we can update her personal and family histories, and let her know of advances in cancer genetics that may benefit the family. Our contact number was provided.  Ms. Belanger questions were answered to her satisfaction today, and she knows she is welcome to call anytime with additional questions.    Jeanine Luz, MS Genetic Counselor Phone: [phone] [email or web address]

## 2015-05-26 ENCOUNTER — Telehealth: Payer: Self-pay | Admitting: Genetic Counselor

## 2015-05-26 DIAGNOSIS — Z9013 Acquired absence of bilateral breasts and nipples: Secondary | ICD-10-CM | POA: Insufficient documentation

## 2015-06-02 NOTE — Telephone Encounter (Signed)
Called and left message about new support group [place], if interested call my office at [phone].

## 2015-06-08 ENCOUNTER — Ambulatory Visit (HOSPITAL_BASED_OUTPATIENT_CLINIC_OR_DEPARTMENT_OTHER): Admit: 2015-06-08 | Payer: 59 | Admitting: Plastic Surgery

## 2015-06-08 ENCOUNTER — Encounter (HOSPITAL_BASED_OUTPATIENT_CLINIC_OR_DEPARTMENT_OTHER): Payer: Self-pay

## 2015-06-08 SURGERY — BREAST RECONSTRUCTION WITH PLACEMENT OF TISSUE EXPANDER AND FLEX HD (ACELLULAR HYDRATED DERMIS)
Anesthesia: General | Site: Breast | Laterality: Bilateral

## 2015-07-16 ENCOUNTER — Emergency Department (HOSPITAL_COMMUNITY)
Admission: EM | Admit: 2015-07-16 | Discharge: 2015-07-16 | Disposition: A | Discharge status: Home / Self Care | Payer: Managed Care, Other (non HMO) | Attending: Emergency Medicine | Admitting: Emergency Medicine

## 2015-07-16 ENCOUNTER — Encounter (HOSPITAL_COMMUNITY): Payer: Self-pay | Admitting: Emergency Medicine

## 2015-07-16 ENCOUNTER — Emergency Department (HOSPITAL_COMMUNITY): Payer: Managed Care, Other (non HMO)

## 2015-07-16 DIAGNOSIS — Z79899 Other long term (current) drug therapy: Secondary | ICD-10-CM | POA: Insufficient documentation

## 2015-07-16 DIAGNOSIS — R319 Hematuria, unspecified: Secondary | ICD-10-CM | POA: Diagnosis present

## 2015-07-16 DIAGNOSIS — N200 Calculus of kidney: Secondary | ICD-10-CM | POA: Diagnosis not present

## 2015-07-16 DIAGNOSIS — Z90710 Acquired absence of both cervix and uterus: Secondary | ICD-10-CM | POA: Insufficient documentation

## 2015-07-16 DIAGNOSIS — R1031 Right lower quadrant pain: Secondary | ICD-10-CM

## 2015-07-16 LAB — URINALYSIS, ROUTINE W REFLEX MICROSCOPIC
BILIRUBIN URINE: NEGATIVE
Bilirubin Urine: NEGATIVE
Glucose, UA: NEGATIVE mg/dL
Glucose, UA: NEGATIVE mg/dL
Ketones, ur: NEGATIVE mg/dL
Ketones, ur: NEGATIVE mg/dL
Leukocytes, UA: NEGATIVE
NITRITE: NEGATIVE
Nitrite: NEGATIVE
PROTEIN: 30 mg/dL — AB
Protein, ur: NEGATIVE mg/dL
SPECIFIC GRAVITY, URINE: 1.019 (ref 1.005–1.030)
Specific Gravity, Urine: 1.005 (ref 1.005–1.030)
pH: 6 (ref 5.0–8.0)
pH: 7.5 (ref 5.0–8.0)

## 2015-07-16 LAB — URINE MICROSCOPIC-ADD ON

## 2015-07-16 LAB — I-STAT CHEM 8, ED
BUN: 14 mg/dL (ref 6–20)
CALCIUM ION: 1.26 mmol/L — AB (ref 1.12–1.23)
CHLORIDE: 104 mmol/L (ref 101–111)
Creatinine, Ser: 0.7 mg/dL (ref 0.44–1.00)
GLUCOSE: 102 mg/dL — AB (ref 65–99)
HCT: 40 % (ref 36.0–46.0)
Hemoglobin: 13.6 g/dL (ref 12.0–15.0)
Potassium: 3.5 mmol/L (ref 3.5–5.1)
SODIUM: 142 mmol/L (ref 135–145)
TCO2: 26 mmol/L (ref 0–100)

## 2015-07-16 MED ORDER — OXYCODONE-ACETAMINOPHEN 5-325 MG PO TABS: 1.0000 | ORAL_TABLET | ORAL | Status: DC | PRN

## 2015-07-16 MED ORDER — TAMSULOSIN HCL 0.4 MG PO CAPS: 0.4000 mg | ORAL_CAPSULE | Freq: Every day | ORAL | Status: DC

## 2015-07-16 NOTE — ED Notes (Signed)
Pt reports that on Tuesday her urine looked like tea stained, Wednesday urine appeared more like blood in it. Symptoms resolved.  Pt had a hysterectomy August 1st and recent sexual experience that brought on the symptoms again. Pt denies burning with urination.

## 2015-07-16 NOTE — ED Provider Notes (Signed)
CSN: [id number]     Arrival date & time 07/16/15  0756 History   First MD Initiated Contact with Patient 07/16/15 0801     Chief Complaint  Patient presents with  . Hematuria    HPI    44 for female presents today with complaints of hematuria. Patient reports that on Tuesday proximal 5 days ago she had an episode of what she described as tea stained urine. She attributed to post coitus bleeding as she hadn't been sexually active and extended period of time. She reports that after that episode she had clear urine and she denied any other complaints including abdominal pain, nausea, vomiting, vaginal discharge or bleeding. Patient reports that yesterday she experienced very mild right lower quadrant abdominal pain. This pain was not worsened by movement, palpation; described as dull. She reports that this morning she had brown urine (no clots) , with no worsening of abdominal pain. She continues to deny any nausea, vomiting, upper abdominal pain, vaginal bleeding or discharge. Patient reports that she is leaving for [facility] this evening and would like to be evaluated prior to her trip. She reports that she normally has some urinary frequency, no acute changes in that, no burning with urination, odorous urine. Patient denies any other complaints including fever, chills, nausea, vomiting. She does report a significant past medical history of BRCA1 mutation resulting in both elective mastectomy and hysterectomy.    History reviewed. No pertinent past medical history. Past Surgical History  Procedure Laterality Date  . Abdominal hysterectomy    . Mastectomy     Family History  Problem Relation Age of Onset  . Other Father     BRCA1 mutation + (based on known paternal FHx in distant cousins)  . Lupus Maternal Aunt   . Congestive Heart Failure Maternal Grandmother 56  . Prostate cancer Maternal Grandfather 80  . Breast cancer Paternal Grandmother     dx. 43 or younger  . Heart attack Paternal  Grandfather 57  . Other Paternal Grandfather     presumed carrier of BRCA1 mutation  . Ovarian cancer Other    Social History  Substance Use Topics  . Smoking status: Never Smoker   . Smokeless tobacco: Never Used  . Alcohol Use: 1.8 oz/week    3 Glasses of wine per week     Comment: per week   OB History    No data available     Review of Systems  All other systems reviewed and are negative.  Allergies  Review of patient's allergies indicates no known allergies.  Home Medications   Prior to Admission medications   Medication Sig Start Date End Date Taking? Authorizing Provider  buPROPion (WELLBUTRIN SR) 100 MG 12 hr tablet TK 1 T PO BID 01/09/15   Historical Provider, MD  oxyCODONE-acetaminophen (ROXICET) 5-325 MG tablet Take 1 tablet by mouth every 4 (four) hours as needed for severe pain. 07/16/15   Okey Regal, PA-C  tamsulosin (FLOMAX) 0.4 MG CAPS capsule Take 1 capsule (0.4 mg total) by mouth daily. 07/16/15   Okey Regal, PA-C  Vitamin D, Ergocalciferol, (DRISDOL) 50000 UNITS CAPS capsule TK 1 C PO WEEKLY 12/23/14   Historical Provider, MD   BP 126/79 mmHg  Pulse 78  Temp(Src) 97.1 F (36.2 C)  Resp 18  Ht '5\' 6"'$  (1.676 m)  Wt 83.915 kg  BMI 29.87 kg/m2  SpO2 98%  LMP 01/27/2015   Physical Exam  Constitutional: She is oriented to person, place, and time. She appears  well-developed and well-nourished.  HENT:  Head: Normocephalic and atraumatic.  Eyes: Conjunctivae are normal. Pupils are equal, round, and reactive to light. Right eye exhibits no discharge. Left eye exhibits no discharge. No scleral icterus.  Neck: Normal range of motion. No JVD present. No tracheal deviation present.  Pulmonary/Chest: Effort normal. No stridor.  Abdominal: Soft. She exhibits no distension and no mass. There is no tenderness. There is no rebound and no guarding.  Musculoskeletal: Normal range of motion. She exhibits no edema or tenderness.  Neurological: She is alert and  oriented to person, place, and time. Coordination normal.  Skin: Skin is warm and dry. No rash noted. No erythema. No pallor.  Psychiatric: She has a normal mood and affect. Her behavior is normal. Judgment and thought content normal.  Nursing note and vitals reviewed.    ED Course  Procedures (including critical care time) Labs Review Labs Reviewed  URINALYSIS, ROUTINE W REFLEX MICROSCOPIC (NOT AT [facility]) - Abnormal; Notable for the following:    APPearance CLOUDY (*)    Hgb urine dipstick LARGE (*)    Protein, ur 30 (*)    Leukocytes, UA SMALL (*)    All other components within normal limits  URINE MICROSCOPIC-ADD ON - Abnormal; Notable for the following:    Squamous Epithelial / LPF 6-30 (*)    Bacteria, UA MANY (*)    All other components within normal limits  URINALYSIS, ROUTINE W REFLEX MICROSCOPIC (NOT AT [facility]) - Abnormal; Notable for the following:    Hgb urine dipstick LARGE (*)    All other components within normal limits  URINE MICROSCOPIC-ADD ON - Abnormal; Notable for the following:    Squamous Epithelial / LPF 0-5 (*)    Bacteria, UA RARE (*)    All other components within normal limits  I-STAT CHEM 8, ED - Abnormal; Notable for the following:    Glucose, Bld 102 (*)    Calcium, Ion 1.26 (*)    All other components within normal limits  URINE CULTURE    Imaging Review Ct Renal Stone Study  07/16/2015  CLINICAL DATA:  Pt reports that on Tuesday her urine looked like tea stained, Wednesday urine appeared more like blood in it. Symptoms resolved. Pt had a hysterectomy August 1st and recent sexual experience that brought on the symptoms again. P.*comment was truncated* EXAM: CT ABDOMEN AND PELVIS WITHOUT CONTRAST TECHNIQUE: Multidetector CT imaging of the abdomen and pelvis was performed following the standard protocol without IV contrast. COMPARISON:  01/19/2015 FINDINGS: Visualized lung bases clear. Unremarkable liver, nondilated gallbladder, spleen, adrenal glands,  left kidney, pancreas. Unenhanced CT was performed per clinician order. Lack of IV contrast limits sensitivity and specificity, especially for evaluation of abdominal/pelvic solid viscera. There is mild right hydronephrosis and ureterectasis to the level of a 71m mid ureteral calculus. Urinary bladder is incompletely distended. Stomach, small bowel, and colon are nondilated. Appendix not identified. No ascites. No free air. No adenopathy. Interval hysterectomy and oophorectomy since previous exam. Minimal calcified plaque in the nondilated abdominal aorta. Regional bones unremarkable. IMPRESSION: 1. 10 mm partially obstructing mid right ureteral calculus. Electronically Signed   By: DLucrezia EuropeM.D.   On: 07/16/2015 09:57   I have personally reviewed and evaluated these images and lab results as part of my medical decision-making.   EKG Interpretation None      MDM   Final diagnoses:  RLQ abdominal pain  Kidney stone    Labs: Urinalysis, i-STAT Chem-8- no significant findings  Imaging: CT renal stone study- 10 mm partially obstructing mild right ureteral calculus  Consults:  Therapeutics:  Discharge Meds:   Assessment/Plan: 44 year old female presents today with gross hematuria with kidney stone evidence by CT scan. Patient has very minimal discomfort in the right lower quadrant, not requiring pain medication at this time. Original urinalysis showed contamination, repeat urinalysis showed negative leukocytes, patient has no urinary tract infection symptoms. She'll be discharged home on oxycodone, and tamsulosin. Previous studies noted 5 mm calculus, today's 10 mm calculus. Patient has never had any complaints of kidney stones or any complications. She will be discharged home with instructions to follow-up with urology for further evaluation and management. She is instructed to return to emergency room if any new or worsening signs or symptoms present. Patient verbalized understanding and  agreement for today's plan and had no further questions or concerns at time of discharge         Okey Regal, PA-C 07/16/15 [place], MD 07/16/15 1806

## 2015-07-16 NOTE — ED Notes (Signed)
PA at bedside.

## 2015-07-16 NOTE — Discharge Instructions (Signed)
Dietary Guidelines to Help Prevent Kidney Stones Your risk of kidney stones can be decreased by adjusting the foods you eat. The most important thing you can do is drink enough fluid. You should drink enough fluid to keep your urine clear or pale yellow. The following guidelines provide specific information for the type of kidney stone you have had. GUIDELINES ACCORDING TO TYPE OF KIDNEY STONE Calcium Oxalate Kidney Stones  Reduce the amount of salt you eat. Foods that have a lot of salt cause your body to release excess calcium into your urine. The excess calcium can combine with a substance called oxalate to form kidney stones.  Reduce the amount of animal protein you eat if the amount you eat is excessive. Animal protein causes your body to release excess calcium into your urine. Ask your dietitian how much protein from animal sources you should be eating.  Avoid foods that are high in oxalates. If you take vitamins, they should have less than 500 mg of vitamin C. Your body turns vitamin C into oxalates. You do not need to avoid fruits and vegetables high in vitamin C. Calcium Phosphate Kidney Stones  Reduce the amount of salt you eat to help prevent the release of excess calcium into your urine.  Reduce the amount of animal protein you eat if the amount you eat is excessive. Animal protein causes your body to release excess calcium into your urine. Ask your dietitian how much protein from animal sources you should be eating.  Get enough calcium from food or take a calcium supplement (ask your dietitian for recommendations). Food sources of calcium that do not increase your risk of kidney stones include:  Broccoli.  Dairy products, such as cheese and yogurt.  Pudding. Uric Acid Kidney Stones  Do not have more than 6 oz of animal protein per day. FOOD SOURCES Animal Protein Sources  Meat (all types).  Poultry.  Eggs.  Fish, seafood. Foods High in MirantSalt  Salt seasonings.  Soy  sauce.  Teriyaki sauce.  Cured and processed meats.  Salted crackers and snack foods.  Fast food.  Canned soups and most canned foods. Foods High in Oxalates  Grains:  Amaranth.  Barley.  Grits.  Wheat germ.  Bran.  Buckwheat flour.  All bran cereals.  Pretzels.  Whole wheat bread.  Vegetables:  Beans (wax).  Beets and beet greens.  Collard greens.  Eggplant.  Escarole.  Leeks.  Okra.  Parsley.  Rutabagas.  Spinach.  Swiss chard.  Tomato paste.  Fried potatoes.  Sweet potatoes.  Fruits:  Red currants.  Figs.  Kiwi.  Rhubarb.  Meat and Other Protein Sources:  Beans (dried).  Soy burgers and other soybean products.  Miso.  Nuts (peanuts, almonds, pecans, cashews, hazelnuts).  Nut butters.  Sesame seeds and tahini (paste made of sesame seeds).  Poppy seeds.  Beverages:  Chocolate drink mixes.  Soy milk.  Instant iced tea.  Juices made from high-oxalate fruits or vegetables.  Other:  Carob.  Chocolate.  Fruitcake.  Marmalades.   This information is not intended to replace advice given to you by your health care provider. Make sure you discuss any questions you have with your health care provider.   Document Released: 11/02/2010 Document Revised: 07/13/2013 Document Reviewed: 06/04/2013 Elsevier Interactive Patient Education [employer]2016 Elsevier Inc.  Please read attached information. If you experience any new or worsening signs or symptoms please return to the emergency room for evaluation. Please follow-up with your primary care provider or specialist  as discussed. Please use medication prescribed only as directed and discontinue taking if you have any concerning signs or symptoms.

## 2015-07-16 NOTE — ED Notes (Signed)
Called pt, reports that the PA gave her the D/C papers and took her IV out.

## 2015-07-17 LAB — URINE CULTURE: Special Requests: NORMAL

## 2015-07-27 ENCOUNTER — Other Ambulatory Visit: Payer: Self-pay | Admitting: Urology

## 2015-07-28 ENCOUNTER — Encounter (HOSPITAL_COMMUNITY)
Admission: RE | Admit: 2015-07-28 | Discharge: 2015-07-28 | Disposition: A | Discharge status: Home / Self Care | Payer: Managed Care, Other (non HMO) | Source: Ambulatory Visit | Attending: Urology | Admitting: Urology

## 2015-07-28 ENCOUNTER — Encounter (HOSPITAL_COMMUNITY): Payer: Self-pay

## 2015-07-28 DIAGNOSIS — Z79899 Other long term (current) drug therapy: Secondary | ICD-10-CM | POA: Diagnosis not present

## 2015-07-28 DIAGNOSIS — Z79891 Long term (current) use of opiate analgesic: Secondary | ICD-10-CM | POA: Diagnosis not present

## 2015-07-28 DIAGNOSIS — N201 Calculus of ureter: Secondary | ICD-10-CM | POA: Diagnosis present

## 2015-07-28 DIAGNOSIS — Z87442 Personal history of urinary calculi: Secondary | ICD-10-CM | POA: Diagnosis not present

## 2015-07-28 HISTORY — DX: Calculus of kidney: N20.0

## 2015-07-28 LAB — BASIC METABOLIC PANEL
Anion gap: 7 (ref 5–15)
BUN: 16 mg/dL (ref 6–20)
CHLORIDE: 103 mmol/L (ref 101–111)
CO2: 29 mmol/L (ref 22–32)
Calcium: 10 mg/dL (ref 8.9–10.3)
Creatinine, Ser: 0.79 mg/dL (ref 0.44–1.00)
GFR calc Af Amer: >60 mL/min (ref >60)
GFR calc non Af Amer: >60 mL/min (ref >60)
Glucose, Bld: 95 mg/dL (ref 65–99)
POTASSIUM: 5 mmol/L (ref 3.5–5.1)
SODIUM: 139 mmol/L (ref 135–145)

## 2015-07-28 LAB — CBC
HEMATOCRIT: 41.7 % (ref 36.0–46.0)
Hemoglobin: 13.6 g/dL (ref 12.0–15.0)
MCH: 28.8 pg (ref 26.0–34.0)
MCHC: 32.6 g/dL (ref 30.0–36.0)
MCV: 88.2 fL (ref 78.0–100.0)
Platelets: 292 10*3/uL (ref 150–400)
RBC: 4.73 MIL/uL (ref 3.87–5.11)
RDW: 12.3 % (ref 11.5–15.5)
WBC: 6.6 10*3/uL (ref 4.0–10.5)

## 2015-07-28 NOTE — Patient Instructions (Signed)
Aimee Mcdaniel  07/28/2015   Your procedure is scheduled on: 07-31-15 Monday  Report to [facility] Main  Entrance take [facility]  elevators to 3rd floor to  Short Stay Center at  1:00 PM.  Call this number if you have problems the morning of surgery [phone]   Remember: ONLY 1 PERSON MAY GO WITH YOU TO SHORT STAY TO GET  READY MORNING OF YOUR SURGERY.  Do not eat food or drink liquids :After Midnight. Exception- may have Clear liquids only 12 midnight until 0900 AM day of surgery, then nothing.     Take these medicines the morning of surgery with A SIP OF WATER: Oxycodone - if need. DO NOT TAKE ANY DIABETIC MEDICATIONS DAY OF YOUR SURGERY                               You may not have any metal on your body including hair pins and              piercings  Do not wear jewelry, make-up, lotions, powders or perfumes, deodorant             Do not wear nail polish.  Do not shave  48 hours prior to surgery.              Men may shave face and neck.   Do not bring valuables to the hospital. [facility] IS NOT             RESPONSIBLE   FOR VALUABLES.  Contacts, dentures or bridgework may not be worn into surgery.  Leave suitcase in the car. After surgery it may be brought to your room.     Patients discharged the day of surgery will not be allowed to drive home.  Name and phone number of your driver: Aimee Mcdaniel- spouse [phone] cell  Special Instructions: N/A              Please read over the following fact sheets you were given: _____________________________________________________________________                CLEAR LIQUID DIET   Foods Allowed                                                                     Foods Excluded  Coffee and tea, regular and decaf                             liquids that you cannot  Plain Jell-O in any flavor                                             see through such as: Fruit ices (not with fruit pulp)                                      milk,  soups, orange juice  Iced Popsicles                                    All solid food Carbonated beverages, regular and diet                                    Cranberry, grape and apple juices Sports drinks like Gatorade Lightly seasoned clear broth or consume(fat free) Sugar, honey syrup  Sample Menu Breakfast                                Lunch                                     Supper Cranberry juice                    Beef broth                            Chicken broth Jell-O                                     Grape juice                           Apple juice Coffee or tea                        Jell-O                                      Popsicle                                                Coffee or tea                        Coffee or tea  _____________________________________________________________________  [facility] - Preparing for Surgery Before surgery, you can play an important role.  Because skin is not sterile, your skin needs to be as free of germs as possible.  You can reduce the number of germs on your skin by washing with CHG (chlorahexidine gluconate) soap before surgery.  CHG is an antiseptic cleaner which kills germs and bonds with the skin to continue killing germs even after washing. Please DO NOT use if you have an allergy to CHG or antibacterial soaps.  If your skin becomes reddened/irritated stop using the CHG and inform your nurse when you arrive at Short Stay. Do not shave (including legs and underarms) for at least 48 hours prior to the first CHG shower.  You may shave your face/neck. Please follow these instructions carefully:  1.  Shower with CHG Soap the night before surgery and the  morning of Surgery.  2.  If you choose to wash your  hair, wash your hair first as usual with your  normal  shampoo.  3.  After you shampoo, rinse your hair and body thoroughly to remove the  shampoo.                           4.  Use CHG as you  would any other liquid soap.  You can apply chg directly  to the skin and wash                       Gently with a scrungie or clean washcloth.  5.  Apply the CHG Soap to your body ONLY FROM THE NECK DOWN.   Do not use on face/ open                           Wound or open sores. Avoid contact with eyes, ears mouth and genitals (private parts).                       Wash face,  Genitals (private parts) with your normal soap.             6.  Wash thoroughly, paying special attention to the area where your surgery  will be performed.  7.  Thoroughly rinse your body with warm water from the neck down.  8.  DO NOT shower/wash with your normal soap after using and rinsing off  the CHG Soap.                9.  Pat yourself dry with a clean towel.            10.  Wear clean pajamas.            11.  Place clean sheets on your bed the night of your first shower and do not  sleep with pets. Day of Surgery : Do not apply any lotions/deodorants the morning of surgery.  Please wear clean clothes to the hospital/surgery center.  FAILURE TO FOLLOW THESE INSTRUCTIONS MAY RESULT IN THE CANCELLATION OF YOUR SURGERY PATIENT SIGNATURE_________________________________  NURSE SIGNATURE__________________________________  ________________________________________________________________________

## 2015-07-28 NOTE — H&P (Signed)
Chief Complaint Right ureteral stone   History of Present Illness Aimee Mcdaniel is a very pleasant 45 year old female seen today for a right ureteral stone. She presented to the emergency department on Christmas morning with a history of 5 days of painless gross hematuria. The morning of Christmas, her urine appeared darker and redder and she was scheduled to leave the country for a vacation a [place] that evening. She was concerned that she might have had an infection although she denied any dysuria, flank pain, fever, or other associated symptoms. She presented to the emergency department and her urinalysis indicated evidence of microscopic hematuria. It was also noted that she had a CT scan this summer prior to her hysterectomy which had confirmed a nonobstructing 5-6 mm right renal calculus. A CT urogram was performed and this confirmed a 9-10 mm mid right ureteral calculus. She really was not having any significant pain and had no complicating features. Her renal function was normal. She subsequently was able to go on her trip to [place] has continued to remain asymptomatic. She presents today as a new patient for further evaluation and treatment. She denies a prior history of urolithiasis. Her only medical history is significant for a bilateral mastectomy and hysterectomy were performed prophylactically due to her BCRA-1 genetic abnormality.   Past Medical History Problems  1. History of BRCA1 positive (Z15.01,Z15.02)  Surgical History Problems  1. History of Breast Surgery Mastectomy 2. History of Hysterectomy  Current Meds 1. BuPROPion HCl ER (SR) 100 MG Oral Tablet Extended Release 12 Hour;  Therapy: (MWNUUVOZ:[id number]) to Recorded 2. Flomax 0.4 MG Oral Capsule (Tamsulosin HCl);  Therapy: (QQVZDGLO:[id number]) to Recorded 3. Roxicet 5-325 MG TABS;  Therapy: (JOACZYSA:[id number]) to Recorded  Allergies Medication  1. No Known Drug Allergies  Family History Problems  1. Family history  of BRCA1 gene positive (Z84.81) : Father 2. Family history of malignant neoplasm of breast (Z80.3) : Paternal Grandmother 3. Family history of prostate cancer (Z80.42) : Maternal Grandfather 4. Family history of systemic lupus erythematosus (Z82.69) : Maternal Aunt  Social History Problems    Alcohol use (Z78.9)   3 glasses of wine per week   Never a smoker  Review of Systems Genitourinary, constitutional, skin, eye, otolaryngeal, hematologic/lymphatic, cardiovascular, pulmonary, endocrine, musculoskeletal, gastrointestinal, neurological and psychiatric system(s) were reviewed and pertinent findings if present are noted and are otherwise negative.  Genitourinary: hematuria.    Vitals Vital Signs [Data Includes: Last 1 Day]  Recorded: [id number] 11:23AM  Blood Pressure: 138 / 96 Heart Rate: 89 Recorded: [id number] 11:16AM  Height: 5 ft 6 in Weight: 184 lb 15.79 oz BMI Calculated: 29.86 BSA Calculated: 1.93  Physical Exam Constitutional: Well nourished and well developed . No acute distress.  ENT:. The ears and nose are normal in appearance.  Neck: The appearance of the neck is normal and no neck mass is present.  Pulmonary: No respiratory distress, normal respiratory rhythm and effort and clear bilateral breath sounds.  Cardiovascular: Heart rate and rhythm are normal . No peripheral edema.  Abdomen: The abdomen is soft and nontender. No masses are palpated. No CVA tenderness. No hernias are palpable. No hepatosplenomegaly noted.  Lymphatics: The femoral and inguinal nodes are not enlarged or tender.  Skin: Normal skin turgor, no visible rash and no visible skin lesions.  Neuro/Psych:. Mood and affect are appropriate.    Results/Data Urine [Data Includes: Last 1 Day]   [id number]  COLOR YELLOW   APPEARANCE CLEAR   SPECIFIC GRAVITY 1.020   pH  5.5   GLUCOSE NEGATIVE   BILIRUBIN NEGATIVE   KETONE NEGATIVE   BLOOD TRACE   PROTEIN NEGATIVE   NITRITE NEGATIVE   LEUKOCYTE  ESTERASE NEGATIVE   SQUAMOUS EPITHELIAL/HPF 0-5 HPF  WBC 0-5 WBC/HPF  RBC 3-10 RBC/HPF  BACTERIA NONE SEEN HPF  CRYSTALS NONE SEEN HPF  CASTS NONE SEEN LPF  Yeast NONE SEEN HPF   I independently reviewed her CT scan from 07/16/15. Findings are as dictated above.    I also independently reviewed her KUB x-ray today. This does demonstrate a calcification in the mid/distal ureter just below the lower edge of the ilium. This measures 8 mm in longitudinal diameter.   Assessment Assessed  1. Ureteral stone (N20.1)  Plan Health Maintenance  1. UA With REFLEX; [Do Not Release]; Status:Complete;   Done: [id number] 10:58AM Ureteral stone  2. Follow-up Schedule Surgery Office  Follow-up  Status: Complete  Done: [id number] 3. KUB; Status:Resulted - Requires Verification;   Done: [id number] 11:43AM  Discussion/Summary 1. Right ureteral calculus: We discussed her ureteral stone and appropriate management options today. Considering the size of her stone, she understands that she is unlikely to spontaneously pass the stone although she does have tamsulosin and I recommended that she continue this medication and strain her urine. Considering the fact that she is asymptomatic, she understands that we can plan for elective treatment and discussed appropriate options today. Considering the fact that her stone is unlikely to be well-visualized for lithotripsy based on its close proximity to the pelvic bone, I did recommend ureteroscopic laser lithotripsy as her most effective treatment option. We reviewed the pros and cons of these approaches and the potential risks and potential complications associated with ureteroscopic laser lithotripsy. She understands the need for a postoperative ureteral stent and has appropriate expectations regarding postoperative recovery. She would like to schedule this electively. She will notify me should she pass her stone. She also has been informed about appropriate stone  precautions and will call us should she develop fever, uncontrolled pain, or persistent nausea/vomiting.    Cc: Dr. Domenick Gong     Signatures Electronically signed by : Raynelle Bring, M.D.; Jul 26 2015 12:46PM EST

## 2015-07-31 ENCOUNTER — Ambulatory Visit (HOSPITAL_COMMUNITY): Payer: Managed Care, Other (non HMO) | Admitting: Certified Registered Nurse Anesthetist

## 2015-07-31 ENCOUNTER — Encounter (HOSPITAL_COMMUNITY): Payer: Self-pay | Admitting: *Deleted

## 2015-07-31 ENCOUNTER — Ambulatory Visit (HOSPITAL_COMMUNITY)
Admission: RE | Admit: 2015-07-31 | Discharge: 2015-07-31 | Disposition: A | Discharge status: Home / Self Care | Payer: Managed Care, Other (non HMO) | Source: Ambulatory Visit | Attending: Urology | Admitting: Urology

## 2015-07-31 ENCOUNTER — Encounter (HOSPITAL_COMMUNITY)
Admission: RE | Disposition: A | Discharge status: Home / Self Care | Payer: Self-pay | Source: Ambulatory Visit | Attending: Urology

## 2015-07-31 DIAGNOSIS — N201 Calculus of ureter: Secondary | ICD-10-CM | POA: Insufficient documentation

## 2015-07-31 DIAGNOSIS — Z87442 Personal history of urinary calculi: Secondary | ICD-10-CM | POA: Insufficient documentation

## 2015-07-31 DIAGNOSIS — Z79899 Other long term (current) drug therapy: Secondary | ICD-10-CM | POA: Insufficient documentation

## 2015-07-31 DIAGNOSIS — Z79891 Long term (current) use of opiate analgesic: Secondary | ICD-10-CM | POA: Insufficient documentation

## 2015-07-31 HISTORY — PX: HOLMIUM LASER APPLICATION: SHX5852

## 2015-07-31 HISTORY — PX: CYSTOSCOPY WITH RETROGRADE PYELOGRAM, URETEROSCOPY AND STENT PLACEMENT: SHX5789

## 2015-07-31 SURGERY — CYSTOURETEROSCOPY, WITH RETROGRADE PYELOGRAM AND STENT INSERTION
Anesthesia: General | Laterality: Right

## 2015-07-31 MED ORDER — SULFAMETHOXAZOLE-TRIMETHOPRIM 800-160 MG PO TABS: 1.0000 | ORAL_TABLET | Freq: Two times a day (BID) | ORAL | Status: DC

## 2015-07-31 MED ORDER — MIDAZOLAM HCL 5 MG/5ML IJ SOLN
INTRAMUSCULAR | Status: DC | PRN
  Administered 2015-07-31: 2 mg via INTRAVENOUS

## 2015-07-31 MED ORDER — LACTATED RINGERS IV SOLN
INTRAVENOUS | Status: DC | PRN
  Administered 2015-07-31: 15:00:00 via INTRAVENOUS

## 2015-07-31 MED ORDER — PHENAZOPYRIDINE HCL 100 MG PO TABS: 100.0000 mg | ORAL_TABLET | Freq: Three times a day (TID) | ORAL | Status: DC | PRN

## 2015-07-31 MED ORDER — ACETAMINOPHEN 10 MG/ML IV SOLN
1000.0000 mg | Freq: Once | INTRAVENOUS | Status: AC
  Administered 2015-07-31: 1000 mg via INTRAVENOUS

## 2015-07-31 MED ORDER — MEPERIDINE HCL 50 MG/ML IJ SOLN: 6.2500 mg | INTRAMUSCULAR | Status: DC | PRN

## 2015-07-31 MED ORDER — SODIUM CHLORIDE 0.9 % IR SOLN
Status: DC | PRN
  Administered 2015-07-31: 3000 mL

## 2015-07-31 MED ORDER — LIDOCAINE HCL (CARDIAC) 20 MG/ML IV SOLN
INTRAVENOUS | Status: DC | PRN
  Administered 2015-07-31: 50 mg via INTRAVENOUS

## 2015-07-31 MED ORDER — FENTANYL CITRATE (PF) 100 MCG/2ML IJ SOLN
INTRAMUSCULAR | Status: AC
  Filled 2015-07-31: qty 2

## 2015-07-31 MED ORDER — ONDANSETRON HCL 4 MG/2ML IJ SOLN
INTRAMUSCULAR | Status: AC
  Filled 2015-07-31: qty 2

## 2015-07-31 MED ORDER — IOHEXOL 300 MG/ML  SOLN
INTRAMUSCULAR | Status: DC | PRN
  Administered 2015-07-31: 13 mL

## 2015-07-31 MED ORDER — LIDOCAINE HCL (CARDIAC) 20 MG/ML IV SOLN
INTRAVENOUS | Status: AC
  Filled 2015-07-31: qty 5

## 2015-07-31 MED ORDER — KETOROLAC TROMETHAMINE 30 MG/ML IJ SOLN
30.0000 mg | Freq: Once | INTRAMUSCULAR | Status: AC
  Administered 2015-07-31: 30 mg via INTRAVENOUS

## 2015-07-31 MED ORDER — DEXAMETHASONE SODIUM PHOSPHATE 10 MG/ML IJ SOLN
INTRAMUSCULAR | Status: DC | PRN
  Administered 2015-07-31: 10 mg via INTRAVENOUS

## 2015-07-31 MED ORDER — CIPROFLOXACIN IN D5W 400 MG/200ML IV SOLN
INTRAVENOUS | Status: AC
  Filled 2015-07-31: qty 200

## 2015-07-31 MED ORDER — FENTANYL CITRATE (PF) 100 MCG/2ML IJ SOLN
25.0000 ug | INTRAMUSCULAR | Status: DC | PRN
  Administered 2015-07-31: 50 ug via INTRAVENOUS
  Administered 2015-07-31: 25 ug via INTRAVENOUS

## 2015-07-31 MED ORDER — LACTATED RINGERS IV SOLN: INTRAVENOUS | Status: DC

## 2015-07-31 MED ORDER — MIDAZOLAM HCL 2 MG/2ML IJ SOLN
INTRAMUSCULAR | Status: AC
  Filled 2015-07-31: qty 2

## 2015-07-31 MED ORDER — DEXAMETHASONE SODIUM PHOSPHATE 10 MG/ML IJ SOLN
INTRAMUSCULAR | Status: AC
  Filled 2015-07-31: qty 1

## 2015-07-31 MED ORDER — FENTANYL CITRATE (PF) 100 MCG/2ML IJ SOLN
INTRAMUSCULAR | Status: DC | PRN
  Administered 2015-07-31 (×4): 25 ug via INTRAVENOUS

## 2015-07-31 MED ORDER — SODIUM CHLORIDE 0.9 % IR SOLN
Status: DC | PRN
  Administered 2015-07-31: 1000 mL

## 2015-07-31 MED ORDER — PROMETHAZINE HCL 25 MG/ML IJ SOLN: 6.2500 mg | INTRAMUSCULAR | Status: DC | PRN

## 2015-07-31 MED ORDER — KETOROLAC TROMETHAMINE 30 MG/ML IJ SOLN
INTRAMUSCULAR | Status: AC
  Filled 2015-07-31: qty 1

## 2015-07-31 MED ORDER — ACETAMINOPHEN 10 MG/ML IV SOLN
INTRAVENOUS | Status: AC
  Filled 2015-07-31: qty 100

## 2015-07-31 MED ORDER — CIPROFLOXACIN IN D5W 400 MG/200ML IV SOLN
400.0000 mg | INTRAVENOUS | Status: AC
  Administered 2015-07-31: 400 mg via INTRAVENOUS

## 2015-07-31 MED ORDER — PROPOFOL 10 MG/ML IV BOLUS
INTRAVENOUS | Status: AC
  Filled 2015-07-31: qty 20

## 2015-07-31 MED ORDER — PROPOFOL 10 MG/ML IV BOLUS
INTRAVENOUS | Status: DC | PRN
Start: 2015-07-31 — End: 2015-07-31
  Administered 2015-07-31: 150 mg via INTRAVENOUS

## 2015-07-31 MED ORDER — ONDANSETRON HCL 4 MG/2ML IJ SOLN
INTRAMUSCULAR | Status: DC | PRN
  Administered 2015-07-31: 4 mg via INTRAVENOUS

## 2015-07-31 MED ORDER — OXYCODONE-ACETAMINOPHEN 5-325 MG PO TABS: 1.0000 | ORAL_TABLET | ORAL | Status: DC | PRN

## 2015-07-31 SURGICAL SUPPLY — 19 items
BAG URO CATCHER STRL LF (MISCELLANEOUS) ×3 IMPLANT
BASKET DAKOTA 1.9FR 11X120 (BASKET) ×2 IMPLANT
CATH INTERMIT  6FR 70CM (CATHETERS) ×3 IMPLANT
CLOTH BEACON ORANGE TIMEOUT ST (SAFETY) ×3 IMPLANT
FIBER LASER FLEXIVA 1000 (UROLOGICAL SUPPLIES) IMPLANT
FIBER LASER FLEXIVA 200 (UROLOGICAL SUPPLIES) IMPLANT
FIBER LASER FLEXIVA 365 (UROLOGICAL SUPPLIES) ×2 IMPLANT
FIBER LASER FLEXIVA 550 (UROLOGICAL SUPPLIES) IMPLANT
FIBER LASER TRAC TIP (UROLOGICAL SUPPLIES) IMPLANT
GLOVE BIOGEL M STRL SZ7.5 (GLOVE) ×3 IMPLANT
GOWN STRL REUS W/TWL LRG LVL3 (GOWN DISPOSABLE) ×6 IMPLANT
GUIDEWIRE ANG ZIPWIRE 038X150 (WIRE) IMPLANT
GUIDEWIRE STR DUAL SENSOR (WIRE) ×3 IMPLANT
MANIFOLD NEPTUNE II (INSTRUMENTS) ×3 IMPLANT
PACK CYSTO (CUSTOM PROCEDURE TRAY) ×3 IMPLANT
SHEATH ACCESS URETERAL 38CM (SHEATH) IMPLANT
STENT URET 6FRX24 CONTOUR (STENTS) ×2 IMPLANT
TUBING CONNECTING 10 (TUBING) ×2 IMPLANT
TUBING CONNECTING 10' (TUBING) ×1

## 2015-07-31 NOTE — Anesthesia Preprocedure Evaluation (Signed)
Anesthesia Evaluation  Patient identified by MRN, date of birth, ID band Patient awake    Reviewed: Allergy & Precautions, NPO status , Patient's Chart, lab work & pertinent test results  Airway Mallampati: II  TM Distance: >3 FB Neck ROM: Full    Dental no notable dental hx.    Pulmonary neg pulmonary ROS,    Pulmonary exam normal breath sounds clear to auscultation       Cardiovascular negative cardio ROS Normal cardiovascular exam Rhythm:Regular Rate:Normal     Neuro/Psych negative neurological ROS  negative psych ROS   GI/Hepatic negative GI ROS, Neg liver ROS,   Endo/Other  negative endocrine ROS  Renal/GU negative Renal ROS  negative genitourinary   Musculoskeletal negative musculoskeletal ROS (+)   Abdominal   Peds negative pediatric ROS (+)  Hematology negative hematology ROS (+)   Anesthesia Other Findings   Reproductive/Obstetrics negative OB ROS                             Anesthesia Physical Anesthesia Plan  ASA: II  Anesthesia Plan: General   Post-op Pain Management:    Induction: Intravenous  Airway Management Planned: LMA  Additional Equipment:   Intra-op Plan:   Post-operative Plan: Extubation in OR  Informed Consent: I have reviewed the patients History and Physical, chart, labs and discussed the procedure including the risks, benefits and alternatives for the proposed anesthesia with the patient or authorized representative who has indicated his/her understanding and acceptance.   Dental advisory given  Plan Discussed with: CRNA  Anesthesia Plan Comments:         Anesthesia Quick Evaluation  

## 2015-07-31 NOTE — Anesthesia Procedure Notes (Signed)
Procedure Name: LMA Insertion Date/Time: 07/31/2015 3:37 PM Performed by: Paris LoreBLANTON, Chlora Mcbain M Pre-anesthesia Checklist: Patient identified, Emergency Drugs available, Suction available, Patient being monitored and Timeout performed Patient Re-evaluated:Patient Re-evaluated prior to inductionOxygen Delivery Method: Circle system utilized Preoxygenation: Pre-oxygenation with 100% oxygen Intubation Type: IV induction Ventilation: Mask ventilation without difficulty LMA: LMA inserted LMA Size: 4.0 Number of attempts: 1 Placement Confirmation: positive ETCO2 and breath sounds checked- equal and bilateral Tube secured with: Tape

## 2015-07-31 NOTE — Interval H&P Note (Signed)
History and Physical Interval Note:  07/31/2015 2:23 PM  Aimee ParisianFrances Sybert  has presented today for surgery, with the diagnosis of RIGHT URETERAL STONE  The various methods of treatment have been discussed with the patient and family. After consideration of risks, benefits and other options for treatment, the patient has consented to  Procedure(s): CYSTOSCOPY WITH RETROGRADE PYELOGRAM, URETEROSCOPY AND STENT PLACEMENT (Right) HOLMIUM LASER APPLICATION (Right) as a surgical intervention .  The patient's history has been reviewed, patient examined, no change in status, stable for surgery.  I have reviewed the patient's chart and labs.  Questions were answered to the patient's satisfaction.     Kryssa Risenhoover,LES

## 2015-07-31 NOTE — Anesthesia Postprocedure Evaluation (Signed)
Anesthesia Post Note  Patient: Aimee ParisianFrances Mcdaniel  Procedure(s) Performed: Procedure(s) (LRB): CYSTOSCOPY WITH RETROGRADE PYELOGRAM, URETEROSCOPY AND STENT PLACEMENT (Right) HOLMIUM LASER APPLICATION (Right)  Patient location during evaluation: PACU Anesthesia Type: General Level of consciousness: awake and alert Pain management: pain level controlled Vital Signs Assessment: post-procedure vital signs reviewed and stable Respiratory status: spontaneous breathing, nonlabored ventilation, respiratory function stable and patient connected to nasal cannula oxygen Cardiovascular status: blood pressure returned to baseline and stable Postop Assessment: no signs of nausea or vomiting Anesthetic complications: no    Last Vitals:  Filed Vitals:   07/31/15 1657 07/31/15 1705  BP: 137/78 125/71  Pulse: 68 59  Temp: 36.7 C 36.6 C  Resp: 18 14    Last Pain:  Filed Vitals:   07/31/15 1724  PainSc: 0-No pain                 Phillips Groutarignan, Steen Bisig

## 2015-07-31 NOTE — Discharge Instructions (Signed)
1. You may see some blood in the urine and may have some burning with urination for 48-72 hours. You also may notice that you have to urinate more frequently or urgently after your procedure which is normal.  2. You should call should you develop an inability urinate, fever > 101, persistent nausea and vomiting that prevents you from eating or drinking to stay hydrated.  3. If you have a stent, you will likely urinate more frequently and urgently until the stent is removed and you may experience some discomfort/pain in the lower abdomen and flank especially when urinating. You may take pain medication prescribed to you if needed for pain. You may also intermittently have blood in the urine until the stent is removed. 4. You may remove your stent on Friday morning.  You should begin your antibiotic (TMP-Sulfamethaxazole) that morning.  You can then un-tape the string from your body and pull the string on the stent which is probably easiest to do in the shower as some urine will also come out.  The stent should come out very easily.  If you experience any discomfort or pain afterwards, take your pain medication and this is usually self limited.

## 2015-07-31 NOTE — Transfer of Care (Signed)
Immediate Anesthesia Transfer of Care Note  Patient: Aimee Mcdaniel  Procedure(s) Performed: Procedure(s): CYSTOSCOPY WITH RETROGRADE PYELOGRAM, URETEROSCOPY AND STENT PLACEMENT (Right) HOLMIUM LASER APPLICATION (Right)  Patient Location: PACU  Anesthesia Type:General  Level of Consciousness:  sedated, patient cooperative and responds to stimulation  Airway & Oxygen Therapy:Patient Spontanous Breathing and Patient connected to face mask oxgen  Post-op Assessment:  Report given to PACU RN and Post -op Vital signs reviewed and stable  Post vital signs:  Reviewed and stable  Last Vitals:  Filed Vitals:   07/31/15 1351  BP: 130/80  Pulse: 71  Temp: 36.8 C  Resp: 16    Complications: No apparent anesthesia complications

## 2015-07-31 NOTE — Op Note (Signed)
Preoperative diagnosis: Right ureteral calculus  Postoperative diagnosis: Right ureteral calculus  Procedure:  1. Cystoscopy 2. Right ureteroscopy and stone removal 3. Ureteroscopic laser lithotripsy 4. Right ureteral stent placement (6 x 24) with string 5. Right retrograde pyelography with interpretation  Surgeon: Moody BruinsLester S. Haniah Penny, Jr. M.D.  Anesthesia: General  Complications: None  Intraoperative findings: Left retrograde pyelography demonstrated a filling defect within the left ureter consistent with the patient's known calculus without other abnormalities.  EBL: Minimal  Specimens: 1. Left ureteral calculus  Disposition of specimens: [facility] for stone analysis  Indication: Aimee ParisianFrances Mcdaniel is a 45 y.o. year old patient with a left ureteral stone. After reviewing the management options for treatment, the patient elected to proceed with the above surgical procedure(s). We have discussed the potential benefits and risks of the procedure, side effects of the proposed treatment, the likelihood of the patient achieving the goals of the procedure, and any potential problems that might occur during the procedure or recuperation. Informed consent has been obtained.  Description of procedure:  The patient was taken to the operating room and general anesthesia was induced.  The patient was placed in the dorsal lithotomy position, prepped and draped in the usual sterile fashion, and preoperative antibiotics were administered. A preoperative time-out was performed.   Cystourethroscopy was performed.  The patient's urethra was examined and was normal. The bladder was then systematically examined in its entirety. There was no evidence for any bladder tumors, stones, or other mucosal pathology.    Attention then turned to the left ureteral orifice and a ureteral catheter was used to intubate the ureteral orifice.  Omnipaque contrast was injected through the ureteral catheter  and a retrograde pyelogram was performed with findings as dictated above.  A 0.38 sensor guidewire was then advanced up the left ureter into the renal pelvis under fluoroscopic guidance. The 6 Fr semirigid ureteroscope was then advanced into the ureter next to the guidewire and the calculus was identified.   The stone was then fragmented with the 365 micron holmium laser fiber on a setting of 0.6 J and frequency of 6 Hz.   All stones were then removed from the ureter with a zero tip nitinol basket.  Reinspection of the ureter revealed no remaining visible stones or fragments.   The wire was then backloaded through the cystoscope and a ureteral stent was advance over the wire using Seldinger technique.  The stent was positioned appropriately under fluoroscopic and cystoscopic guidance.  The wire was then removed with an adequate stent curl noted in the renal pelvis as well as in the bladder.  The bladder was then emptied and the procedure ended.  The patient appeared to tolerate the procedure well and without complications.  The patient was able to be awakened and transferred to the recovery unit in satisfactory condition.

## 2015-08-01 ENCOUNTER — Encounter (HOSPITAL_COMMUNITY): Payer: Self-pay | Admitting: Urology

## 2015-08-01 NOTE — Progress Notes (Signed)
Called patient after cystoscopy , laser lithotripsy, and right ureteral stent. Patient had accidentally pulled on stent after going home on 07/31/15. Instructed to notify MD immediately for increased pain right flank. She verbalizes understanding.She had called [facility] on 07/31/15 to report pulling on urinary stent.

## 2015-12-11 DIAGNOSIS — E669 Obesity, unspecified: Secondary | ICD-10-CM | POA: Insufficient documentation

## 2015-12-11 DIAGNOSIS — R7989 Other specified abnormal findings of blood chemistry: Secondary | ICD-10-CM | POA: Insufficient documentation

## 2015-12-11 DIAGNOSIS — E781 Pure hyperglyceridemia: Secondary | ICD-10-CM | POA: Insufficient documentation

## 2015-12-11 DIAGNOSIS — E8881 Metabolic syndrome: Secondary | ICD-10-CM | POA: Insufficient documentation

## 2015-12-11 DIAGNOSIS — E785 Hyperlipidemia, unspecified: Secondary | ICD-10-CM | POA: Insufficient documentation

## 2015-12-11 DIAGNOSIS — R635 Abnormal weight gain: Secondary | ICD-10-CM | POA: Insufficient documentation

## 2015-12-11 DIAGNOSIS — R945 Abnormal results of liver function studies: Secondary | ICD-10-CM

## 2015-12-11 DIAGNOSIS — R7303 Prediabetes: Secondary | ICD-10-CM | POA: Insufficient documentation

## 2015-12-11 DIAGNOSIS — Z872 Personal history of diseases of the skin and subcutaneous tissue: Secondary | ICD-10-CM | POA: Insufficient documentation

## 2015-12-19 ENCOUNTER — Ambulatory Visit (INDEPENDENT_AMBULATORY_CARE_PROVIDER_SITE_OTHER): Payer: Managed Care, Other (non HMO) | Admitting: Sports Medicine

## 2015-12-19 ENCOUNTER — Encounter: Payer: Self-pay | Admitting: Sports Medicine

## 2015-12-19 VITALS — BP 140/85 | HR 80 | Ht 66.0 in | Wt 192.0 lb

## 2015-12-19 DIAGNOSIS — M79671 Pain in right foot: Secondary | ICD-10-CM | POA: Diagnosis not present

## 2015-12-19 DIAGNOSIS — M79672 Pain in left foot: Secondary | ICD-10-CM | POA: Diagnosis not present

## 2015-12-19 MED ORDER — AMITRIPTYLINE HCL 10 MG PO TABS: 10.0000 mg | ORAL_TABLET | Freq: Every day | ORAL | Status: AC

## 2015-12-19 NOTE — Assessment & Plan Note (Addendum)
Most likely secondary to multiple factors:  anterior tibialis weakness from immobility, Core weakness Weight gain Estrogen withdrawal   however no tenderness on exam today. She has been on numerous medications over the last several months and is now estrogen deficient which will not help the situation. - green sport insoles  with scaphoid insert today - may need to discuss custom orthotics in the future  - Rx for amitriptyline 10mg  qHS, may increase to 20mg  as needed.   - dicussed heel and toe walking and strengthening hip abductors.  - patient to follow up in 3 months or sooner as needed.   Will attend [facility] weight loss and wellness program

## 2015-12-19 NOTE — Progress Notes (Signed)
Subjective: CC: bilateral foot pain HPI: Patient is a 45 y.o. female with a past medical history of BRACA1 gene presenting to clinic today for bilateral foot pain.  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 She ran/walked at 10K in the [facility] in early  April.. She notes that 4 days later she started having significant bilateral foot pain, mostly in the dorsal aspect. The first few steps in the morning hurts the most. As she walks throught the day, the pain improves. She notes she sometimes has pain with certain movements but can't recall which movements.  Advil does help but she's hesistant to take too many NSAIDs due to rising in her LFTs. Does take Vid D Holding Ca since her kidney stones  Soc Hx ; pharmacist  ROS: All other systems reviewed and are negative. She does c/o weight gain - 30 lbs this year Lower energy level  Past Medical History Patient Active Problem List   Diagnosis Date Noted  . Bilateral foot pain 12/19/2015  . BRCA1 positive 05/02/2015  . Genetic testing 05/02/2015  . Family history of breast cancer in female 05/02/2015    Medications- reviewed and updated Current Outpatient Prescriptions  Medication Sig Dispense Refill  . Vitamin D, Ergocalciferol, (DRISDOL) 50000 units CAPS capsule     . amitriptyline (ELAVIL) 10 MG tablet Take 1 tablet (10 mg total) by mouth at bedtime. May increase to 2 tablets ('20mg'$ ) at bedtime 60 tablet 3  . Cholecalciferol (VITAMIN D) 2000 units tablet Take 2,000 Units by mouth daily.    . Multiple Vitamin (MULTIVITAMIN WITH MINERALS) TABS tablet Take 1 tablet by mouth daily.    Marland Kitchen oxyCODONE-acetaminophen (ROXICET) 5-325 MG tablet Take 1 tablet by mouth every 4 (four) hours as needed for severe pain. 15 tablet 0  .  oxyCODONE-acetaminophen (ROXICET) 5-325 MG tablet Take 1 tablet by mouth every 4 (four) hours as needed for severe pain. 30 tablet 0  . phenazopyridine (PYRIDIUM) 100 MG tablet Take 1 tablet (100 mg total) by mouth 3 (three) times daily as needed for pain (for burning). 20 tablet 0  . sulfamethoxazole-trimethoprim (BACTRIM DS,SEPTRA DS) 800-160 MG tablet Take 1 tablet by mouth 2 (two) times daily. Begin the morning of stent removal. 2 tablet 0   No current facility-administered medications for this visit.    Objective: Office vital signs reviewed. BP 140/85 mmHg  Pulse 80  Ht '5\' 6"'$  (1.676 m)  Wt 192 lb (87.091 kg)  BMI 31.00 kg/m2  LMP 01/27/2015   Physical Examination:  General: Awake, alert, well- nourished, NAD Feet: Normal to inspection. Feet with some loss of longitudinal arch with standing. No visible erythema or swelling. No tenderness over cuboid; No tenderness over N spot or navicular prominence. No tenderness on posterior aspects of lateral and medial malleolus. Full ROM.  Squeeze test unremarkable; Talar dome nontender;  No sign of peroneal tendon subluxations, weakness of the anterior tibialis tendon bilaterally, otherwise normal strength. Neurovascularly intact.    On hip examination, she has weakness of the gluteus medius bilaterally, however good strength in the gluteus maximus. Good strength in hip flexion and knee extension bilaterally.    Assessment/Plan: Bilateral foot pain Most likely secondary to anterior tibialis weakness from immobility, however no tenderness on exam today. She has been on numerous medications over the last  several months and is now estrogen deficient which will not help the situation. - green orthotics with scaphoid insert today - may need to discuss custom orthotics in the future  - Rx for amitriptyline '10mg'$  qHS, may increase to '20mg'$  as needed.   - dicussed heel and toe walking and strengthening hip abductors.  - patient to follow up in 3 months  or sooner as needed.     No orders of the defined types were placed in this encounter.    Meds ordered this encounter  Medications  . Vitamin D, Ergocalciferol, (DRISDOL) 50000 units CAPS capsule    Sig:   . amitriptyline (ELAVIL) 10 MG tablet    Sig: Take 1 tablet (10 mg total) by mouth at bedtime. May increase to 2 tablets ('20mg'$ ) at bedtime    Dispense:  60 tablet    Refill:  [place] PGY-2, [facility]  Reviewed and obaserved/  Agree with plan.  Ila Mcgill, MD

## 2016-01-10 ENCOUNTER — Ambulatory Visit: Payer: Managed Care, Other (non HMO) | Admitting: Sports Medicine

## 2016-03-19 ENCOUNTER — Ambulatory Visit: Payer: Managed Care, Other (non HMO) | Admitting: Sports Medicine

## 2016-03-22 ENCOUNTER — Encounter: Payer: Self-pay | Admitting: Podiatry

## 2016-03-22 ENCOUNTER — Ambulatory Visit (INDEPENDENT_AMBULATORY_CARE_PROVIDER_SITE_OTHER): Payer: Managed Care, Other (non HMO)

## 2016-03-22 ENCOUNTER — Ambulatory Visit (INDEPENDENT_AMBULATORY_CARE_PROVIDER_SITE_OTHER): Payer: Managed Care, Other (non HMO) | Admitting: Podiatry

## 2016-03-22 DIAGNOSIS — M7740 Metatarsalgia, unspecified foot: Secondary | ICD-10-CM

## 2016-03-22 DIAGNOSIS — M779 Enthesopathy, unspecified: Secondary | ICD-10-CM

## 2016-03-22 DIAGNOSIS — M79673 Pain in unspecified foot: Secondary | ICD-10-CM

## 2016-03-22 DIAGNOSIS — M722 Plantar fascial fibromatosis: Secondary | ICD-10-CM | POA: Diagnosis not present

## 2016-03-22 NOTE — Patient Instructions (Signed)

## 2016-03-22 NOTE — Progress Notes (Signed)
   Subjective:    Patient ID: Aimee Mcdaniel, female    DOB: 06-10-1971, 45 y.o.   MRN: [id number]  HPI 45 year old he now presents the office today for concerns of bilateral foot pain which started after doing the [employer] in April. She states that a couple days after she started have pain and she points the ball of her foot as well as the arch where she gets discomfort. She's tried over-the-counter insert with modification by sports medicine this time she is inquiring about possible custom inserts. She does not like to take anti-inflammatories. She has tried changing her shoes. No other complaints at this time.   Review of Systems  Musculoskeletal: Positive for arthralgias and myalgias.  All other systems reviewed and are negative.      Objective:   Physical Exam General: AAO x3, NAD  Dermatological: Skin is warm, dry and supple bilateral. Nails x 10 are well manicured; remaining integument appears unremarkable at this time. There are no open sores, no preulcerative lesions, no rash or signs of infection present.  Vascular: Dorsalis Pedis artery and Posterior Tibial artery pedal pulses are 2/4 bilateral with immedate capillary fill time. Pedal hair growth present. There is no pain with calf compression, swelling, warmth, erythema.   Neruologic: Grossly intact via light touch bilateral. Vibratory intact via tuning fork bilateral. Protective threshold with Semmes Wienstein monofilament intact to all pedal sites bilateral.   Musculoskeletal: There is a mild decrease in the medial arch upon weightbearing. There is discomfort on the navicular tuberosity at the insertion of posterior tibial tendon how there is no pain along the course of posterior tibial tendon. There is mild discomfort along the plantar aspect of the metatarsal heads diffusely how there is no specific area pinpoint tenderness or pain the vibratory sensation. Ankle, subtalar, midtarsal range of motion is intact. There is no  overlying edema, erythema, increase in warmth. MMT 5/5.  Gait: Unassisted, Nonantalgic.      Assessment & Plan:  45 year old female with bilateral foot pain, likely tendinitis/plantar fasciitis, metatarsalgia -Treatment options discussed including all alternatives, risks, and complications -Etiology of symptoms were discussed -X-rays were obtained and reviewed with the patient. No evidence of acute fracture. -Discussed stretching, rehabilitation exercises to help improve the functionality to her foot. -At this time she was scanned for [facility] -Ice to the foot -Discussed shoe gear changes -Up in 3-4 weeks to pick up orthotics or sooner if any issues are to arise. Call any questions or concerns in the meantime.  Ovid CurdMatthew Wagoner, DPM

## 2016-03-26 ENCOUNTER — Encounter: Payer: Managed Care, Other (non HMO) | Admitting: Sports Medicine

## 2016-04-05 ENCOUNTER — Telehealth: Payer: Self-pay | Admitting: *Deleted

## 2016-04-05 NOTE — Telephone Encounter (Signed)
Called patient and left a message 

## 2016-04-05 NOTE — Telephone Encounter (Signed)
Called patient and left a message for the patient call me back, Per Dr Ardelle AntonWagoner just checking on the patient and to see how she is feeling. Misty StanleyLisa

## 2016-04-12 ENCOUNTER — Ambulatory Visit (INDEPENDENT_AMBULATORY_CARE_PROVIDER_SITE_OTHER): Payer: Managed Care, Other (non HMO) | Admitting: Podiatry

## 2016-04-12 ENCOUNTER — Encounter: Payer: Self-pay | Admitting: Podiatry

## 2016-04-12 DIAGNOSIS — M7740 Metatarsalgia, unspecified foot: Secondary | ICD-10-CM

## 2016-04-12 DIAGNOSIS — M722 Plantar fascial fibromatosis: Secondary | ICD-10-CM

## 2016-04-12 NOTE — Patient Instructions (Signed)

## 2016-04-14 NOTE — Progress Notes (Signed)
Patient presents to pick up orthotics. She was seen today by Hadley PenLisa Cox, CMA and orthotics were dispensed. Oral and written break in instructions were discussed the patient. He declined appointment by myself. Following 4 weeks if needed or sooner if any issues are to arise.

## 2016-05-07 ENCOUNTER — Other Ambulatory Visit: Payer: Managed Care, Other (non HMO)

## 2016-05-14 ENCOUNTER — Other Ambulatory Visit: Payer: Managed Care, Other (non HMO)

## 2016-05-24 ENCOUNTER — Ambulatory Visit: Payer: Managed Care, Other (non HMO) | Admitting: Podiatry

## 2016-06-03 ENCOUNTER — Ambulatory Visit: Payer: Managed Care, Other (non HMO) | Admitting: Podiatry

## 2016-06-10 ENCOUNTER — Ambulatory Visit (INDEPENDENT_AMBULATORY_CARE_PROVIDER_SITE_OTHER): Payer: Managed Care, Other (non HMO) | Admitting: Podiatry

## 2016-06-10 ENCOUNTER — Encounter: Payer: Self-pay | Admitting: Podiatry

## 2016-06-10 DIAGNOSIS — M79672 Pain in left foot: Secondary | ICD-10-CM | POA: Diagnosis not present

## 2016-06-10 DIAGNOSIS — M722 Plantar fascial fibromatosis: Secondary | ICD-10-CM | POA: Diagnosis not present

## 2016-06-10 DIAGNOSIS — M7740 Metatarsalgia, unspecified foot: Secondary | ICD-10-CM | POA: Diagnosis not present

## 2016-06-10 DIAGNOSIS — M79671 Pain in right foot: Secondary | ICD-10-CM | POA: Diagnosis not present

## 2016-06-11 NOTE — Progress Notes (Signed)
Subjective: 45 year old female presents the office today for concerns for follow-up evaluation of generalized foot pain to both her feet. She states she's been wearing the orthotics the not been helping. She has no pain at rest and she only gets generalized pain to her feet when standing. Upon asking her to point to an area where she is majority symptoms she is unable to do this and she states is her entire foot on both sides. She denies any swelling or redness. No numbness or tingling. The pain does not wake up at night. No pain with rest. Denies any systemic complaints such as fevers, chills, nausea, vomiting. No acute changes since last appointment, and no other complaints at this time.   Objective: AAO x3, NAD DP/PT pulses palpable bilaterally, CRT less than 3 seconds On the nonweightbearing exam there is no area pinpoint bony tenderness is no pain vibratory sensation. There is no overlying edema, erythema, increase in warmth. Ankle, subtalar range of motion intact. MMT 5/5. There is no areas of tenderness identified patient at today's evaluation. Equinus is present. Overall she states is generalized pain to her feet walking. No open lesions or pre-ulcerative lesions.  No pain with calf compression, swelling, warmth, erythema  Assessment:  Generalized chronic foot pain bilateral feet.  Plan: -All treatment options discussed with the patient including all alternatives, risks, complications.  -I did evaluate orthotics but does not appear to be fitting to her arch well. This and the orthotics back for modifications.  -At this point given generalized foot pain recommended physical therapy. This is ordered today for her. [facility]. -Discussed shoe changes as well.  -Patient encouraged to call the office with any questions, concerns, change in symptoms.   Ovid CurdMatthew Wagoner, DPM

## 2016-07-03 ENCOUNTER — Encounter: Payer: Self-pay | Admitting: Podiatry

## 2016-09-04 ENCOUNTER — Encounter (HOSPITAL_COMMUNITY): Payer: Self-pay

## 2016-10-10 ENCOUNTER — Ambulatory Visit: Payer: Managed Care, Other (non HMO) | Admitting: Sports Medicine

## 2017-02-28 ENCOUNTER — Ambulatory Visit: Payer: 59 | Admitting: Podiatry

## 2017-10-14 ENCOUNTER — Encounter: Payer: Self-pay | Admitting: Sports Medicine

## 2017-10-14 ENCOUNTER — Ambulatory Visit: Payer: 59 | Admitting: Sports Medicine

## 2017-10-14 VITALS — BP 118/78 | Ht 66.0 in | Wt 180.0 lb

## 2017-10-14 DIAGNOSIS — M67952 Unspecified disorder of synovium and tendon, left thigh: Secondary | ICD-10-CM | POA: Diagnosis not present

## 2017-10-14 NOTE — Assessment & Plan Note (Signed)
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  - Provided handout on hip abduction strengthening exercises and demonstrated these.  - May return to biking but avoid/limit pedaling while standing as able.

## 2017-10-14 NOTE — Progress Notes (Addendum)
[facility] Progress [facility]  Subjective:  Aimee Mcdaniel is a 47 y.o. female who presents with left hip pain. Symptoms began a little over a month ago. She had been training on a [place] bike about 4-5 times a week prior to symptoms starting. She also does gentle yoga 1-2 times a week. She has not been running since having foot pains previously but occasionally uses an elliptical. She stopped biking about 1 month ago and has had some improvement in pain. She describes pain as soreness in left lateral hip with occasional numbness/tingling down outer aspect of leg all the way to her foot. She has not tried anything aside from rest for her symptoms. She has noticed certain yoga stretches that bring on pain like cross-overs of left leg. [place] exercises do include standing up and biking at times. ROS: No bladder or bowel incontinence, no falls   Allergies  Allergen Reactions  . Vancomycin Rash    Severe diffuse rash after Vancomycin that required steroids  . Penicillin G Hives    Social History   Tobacco Use  . Smoking status: Never Smoker  . Smokeless tobacco: Never Used  Substance Use Topics  . Alcohol use: Yes    Alcohol/week: 1.8 oz    Types: 3 Glasses of wine per week    Comment: per week    Objective: Blood pressure 118/78, height 5\' 6"  (1.676 m), weight 180 lb (81.6 kg), last menstrual period 01/27/2015. Body mass index is 29.05 kg/m. Constitutional: Well-appearing female, in NAD Musculoskeletal: Spine appears straight with forward flexion. No pain to palpation over midline spine. Good ROM with lateral rotation of spine and extension without pain. No TTP over SI joints. No point tenderness over left greater trochanter. Equal ROM with hip flexion bilaterally with mild pain with external rotation of hip on left but not internally and not with FADIR.  Neurological: AOx3, no focal deficits. 4/5 hip abduction on left compared to 5/5 on right. Patellar reflexes 2+  bilaterally. Normal heel and toe walking. Raises big toes bilaterally. Peripheral sensation intact over LEs.  Skin: Skin is warm and dry. No rash noted.  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  - Provided handout on hip abduction strengthening exercises and demonstrated these.  - May return to biking but avoid/limit pedaling while standing as able.  Follow-up in about 6 weeks if not having much improvement.  Dani GobbleHillary Shade Rivenbark, MD [facility], PGY-3  I observed and examined the patient with the resident and agree with assessment and plan.  Note reviewed and modified by me. Sterling BigKB Fields, MD

## 2017-11-20 ENCOUNTER — Other Ambulatory Visit: Payer: Self-pay | Admitting: Internal Medicine

## 2017-11-20 ENCOUNTER — Ambulatory Visit
Admission: RE | Admit: 2017-11-20 | Discharge: 2017-11-20 | Disposition: A | Discharge status: Home / Self Care | Payer: 59 | Source: Ambulatory Visit | Attending: Internal Medicine | Admitting: Internal Medicine

## 2017-11-20 DIAGNOSIS — R51 Headache: Principal | ICD-10-CM

## 2017-11-20 DIAGNOSIS — R519 Headache, unspecified: Secondary | ICD-10-CM

## 2020-07-07 ENCOUNTER — Other Ambulatory Visit: Payer: Self-pay | Admitting: Internal Medicine

## 2020-07-07 DIAGNOSIS — E7800 Pure hypercholesterolemia, unspecified: Secondary | ICD-10-CM

## 2020-08-08 ENCOUNTER — Inpatient Hospital Stay: Admission: RE | Admit: 2020-08-08 | Payer: 59 | Source: Ambulatory Visit

## 2020-08-24 ENCOUNTER — Inpatient Hospital Stay: Admission: RE | Admit: 2020-08-24 | Payer: 59 | Source: Ambulatory Visit

## 2020-09-15 ENCOUNTER — Other Ambulatory Visit: Payer: 59

## 2020-09-29 ENCOUNTER — Other Ambulatory Visit: Payer: 59

## 2020-10-18 ENCOUNTER — Inpatient Hospital Stay: Admission: RE | Admit: 2020-10-18 | Payer: 59 | Source: Ambulatory Visit

## 2020-10-31 ENCOUNTER — Other Ambulatory Visit: Payer: 59

## 2020-11-14 ENCOUNTER — Ambulatory Visit
Admission: RE | Admit: 2020-11-14 | Discharge: 2020-11-14 | Disposition: A | Discharge status: Home / Self Care | Payer: No Typology Code available for payment source | Source: Ambulatory Visit | Attending: Internal Medicine | Admitting: Internal Medicine

## 2020-11-14 DIAGNOSIS — E7800 Pure hypercholesterolemia, unspecified: Secondary | ICD-10-CM

## 2022-12-04 IMAGING — CT CT CARDIAC CORONARY ARTERY CALCIUM SCORE
3 series · 14 of 20 positions shown, 16 images · non-contrast
Comparison: None.

CLINICAL DATA: Elevated cholesterol

EXAM:
CT CARDIAC CORONARY ARTERY CALCIUM SCORE
TECHNIQUE: Non-contrast imaging through the heart was performed using
prospective ECG gating. Image post processing was performed on an
independent workstation, allowing for quantitative analysis of the
heart and coronary arteries. Note that this exam targets the heart
and the chest was not imaged in its entirety.

[Series 2: calcium scoring 2.00 qr36 bestdiast 63% hrt calciu · axial · 0.42mm/px · z∈[+1676,+1772]mm · 4 of 80 slices shown]
[im 16/80  vessel]
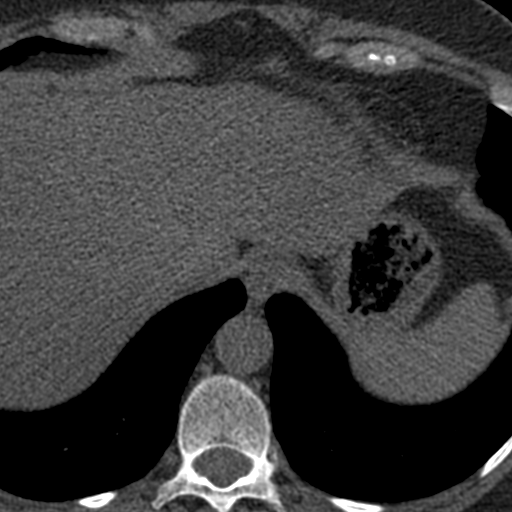
[im 32/80  vessel]
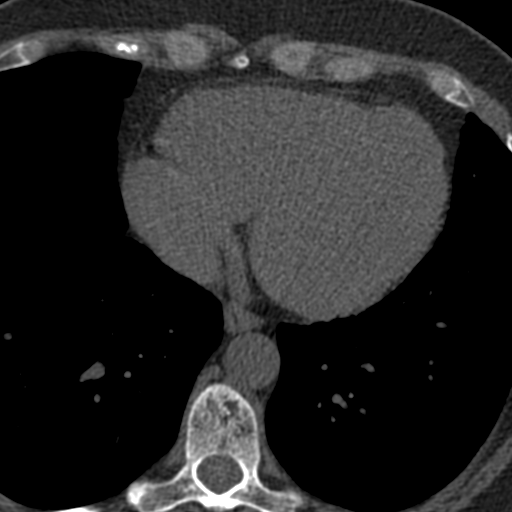
[im 48/80  vessel]
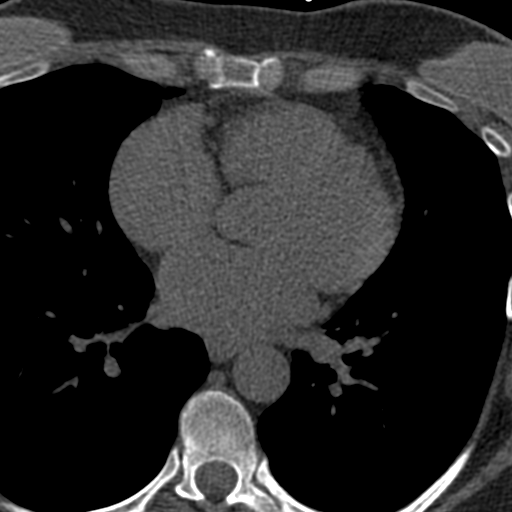
[im 64/80  vessel]
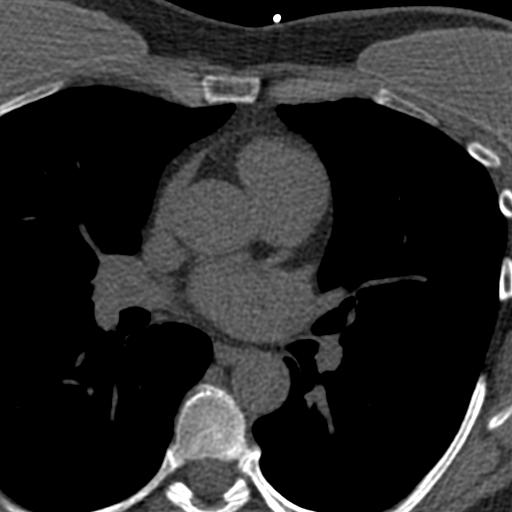

[Series 3: calcium scoring 2.00 br40 bestdiast 63% axial · axial · 0.65mm/px · z∈[+1672,+1776]mm · 5 of 80 slices shown, 7 images]
[im 14/80  vessel]
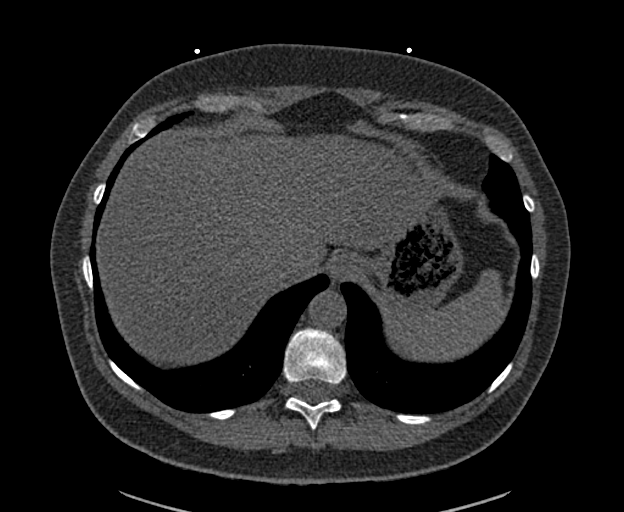
[im 14/80  lung]
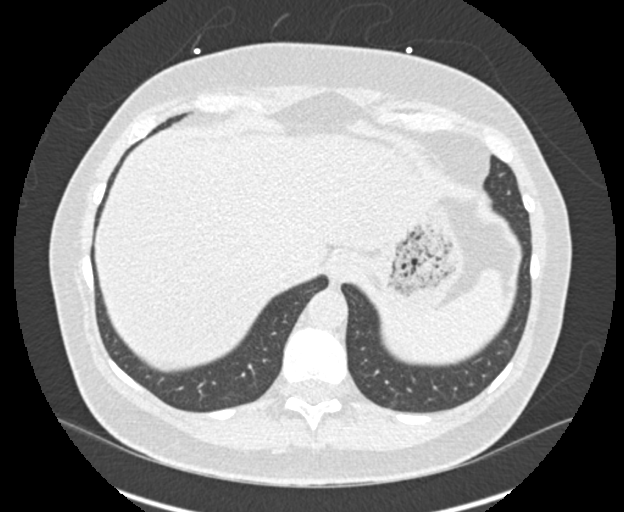
[im 27/80  vessel]
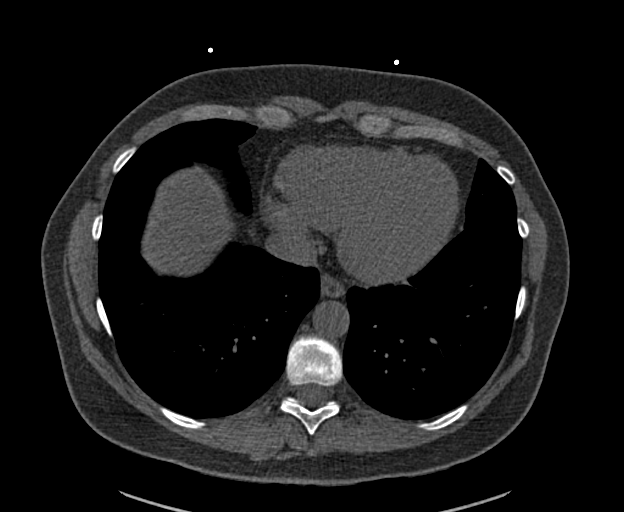
[im 40/80  vessel]
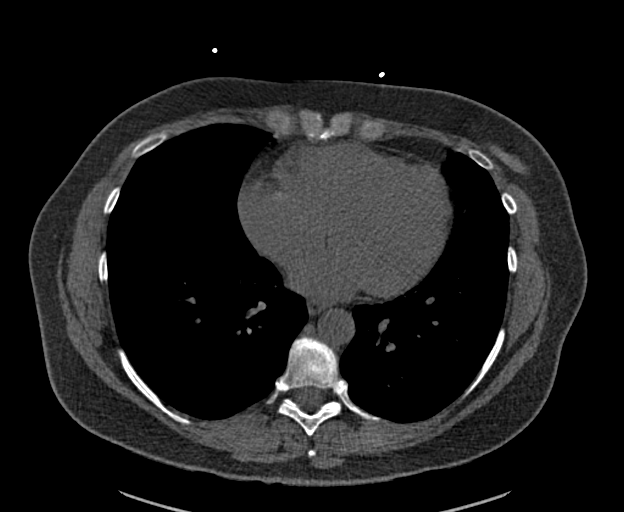
[im 53/80  vessel]
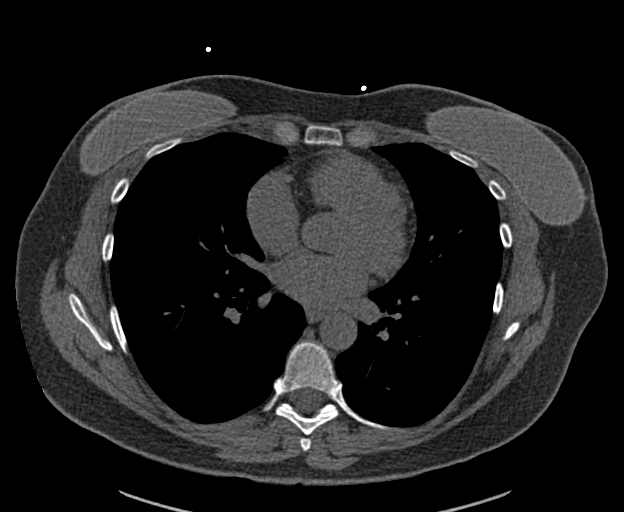
[im 66/80  vessel]
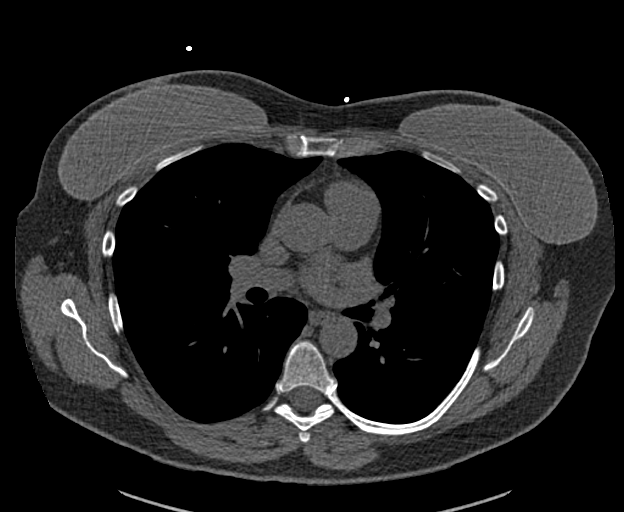
[im 66/80  lung]
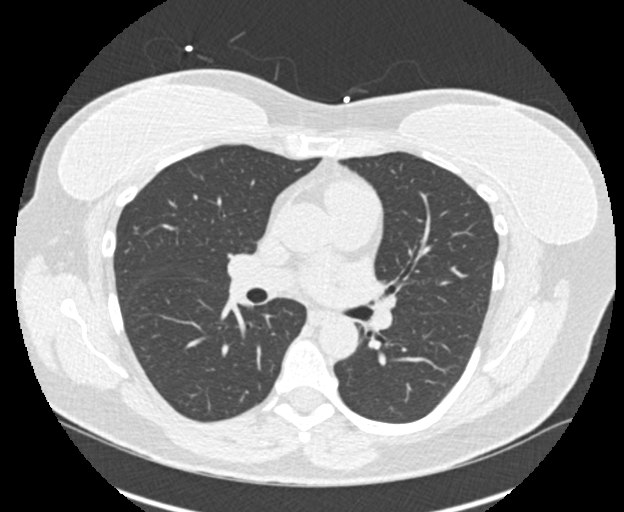

[Series 9: calcium scoring 2.00 br60 bestdiast 63% lungs · axial · 0.65mm/px · z∈[+1672,+1776]mm · 5 of 80 slices shown]
[im 14/80  vessel]
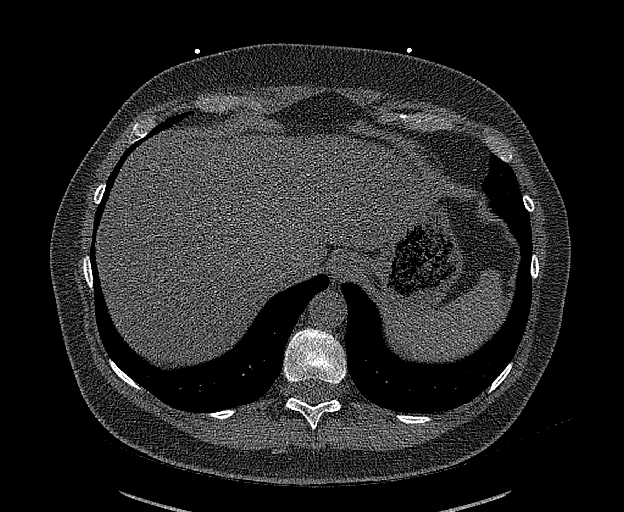
[im 27/80  vessel]
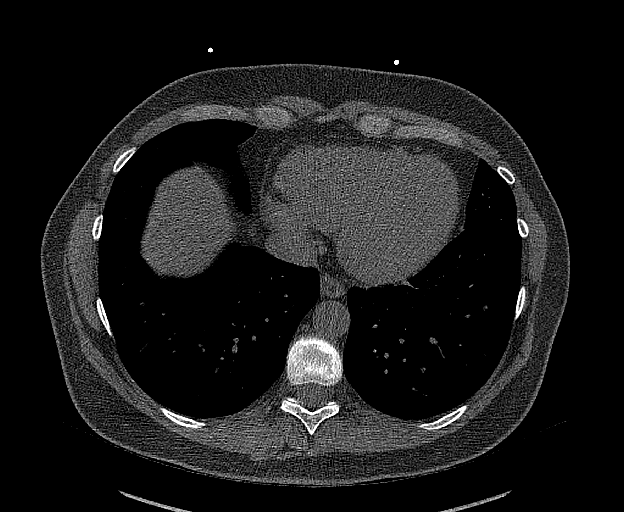
[im 40/80  vessel]
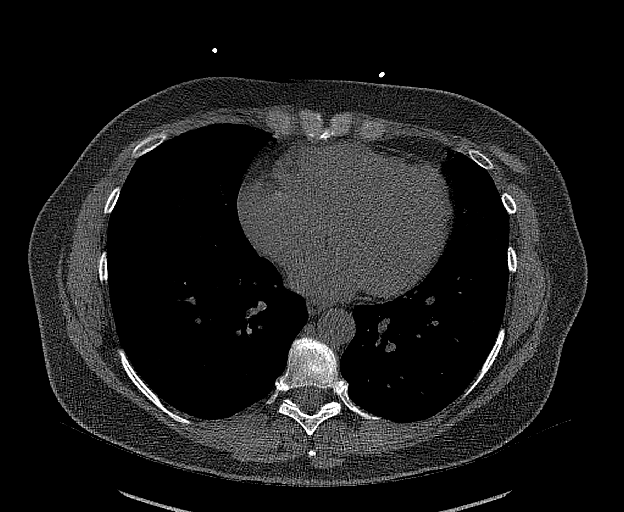
[im 53/80  vessel]
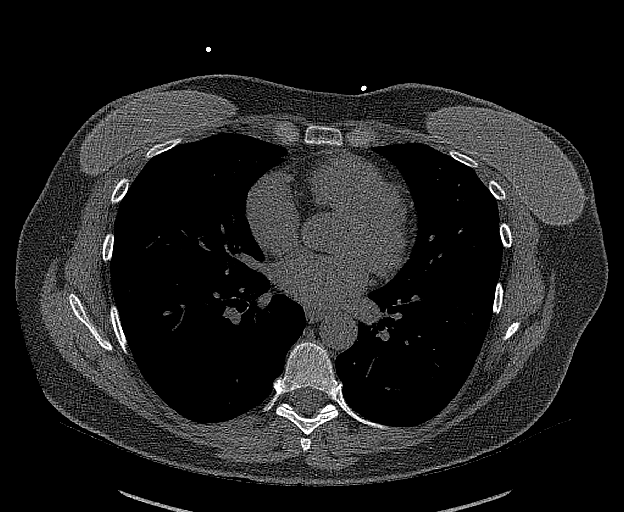
[im 66/80  vessel]
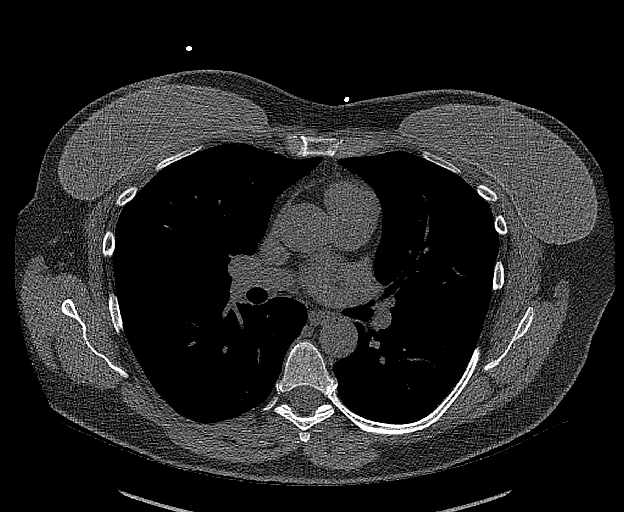

[14 of 20 positions shown; findings below may reference images not displayed]

FINDINGS: CORONARY CALCIUM SCORES:

Left Main: 0

LAD: 0

LCx: 0

RCA: 0

Total Agatston Score: 0

[HOSPITAL] percentile: 0

AORTA MEASUREMENTS:

Ascending Aorta: 31 mm

Descending Aorta: 24 mm

OTHER FINDINGS:

Heart is normal size. Aorta normal caliber. No adenopathy.
Visualized lungs clear. No effusions. Imaging into the upper abdomen
demonstrates no acute findings. No acute bony abnormality.
IMPRESSION: No visible coronary artery calcifications. Total coronary calcium
score of 0.

No acute or significant extracardiac abnormality.

## 2023-05-07 ENCOUNTER — Encounter: Payer: Self-pay | Admitting: Family Medicine

## 2023-05-07 ENCOUNTER — Ambulatory Visit (INDEPENDENT_AMBULATORY_CARE_PROVIDER_SITE_OTHER): Payer: 59 | Admitting: Family Medicine

## 2023-05-07 VITALS — BP 122/86 | Ht 66.0 in | Wt 195.0 lb

## 2023-05-07 DIAGNOSIS — M222X2 Patellofemoral disorders, left knee: Secondary | ICD-10-CM

## 2023-05-07 NOTE — Patient Instructions (Signed)
You have patellofemoral syndrome. Avoid painful activities when possible (often deep squats, lunges, leg press, stairs bother this). Cross train with swimming, cycling with low resistance, elliptical if needed. Start formal physical therapy. Do home exercises on days you don't go to therapy. Avoid flat shoes, barefoot walking as much as possible. Icing 15 minutes at a time 3-4 times a day as needed. Tylenol and/or celecoxib as needed for pain. Follow up with me in 6 weeks.

## 2023-05-07 NOTE — Progress Notes (Signed)
PCP: Gaspar Garbe, MD  Subjective:   HPI: Patient is a 52 y.o. female here for left knee pain.  Patient reports back in March (3/31) she was in a skiing accident She sustained a tibial plateau fracture left knee requiring ORIF with plate and screws. This healed completely. She did extensive physical therapy and been swimming. She was seen by Dr. Luiz Blare in August and had injection due to catching anterior left knee just deep to patella and didn't notice much benefit. They discussed possible arthroscopy. Pain worse with stairs. Had MRI though a lot of artifact due to metal.  She did not have meniscus tear and cruciates were intact.  Edema within med post patella and medial femoral condyle.  Past Medical History:  Diagnosis Date   Right kidney stone    at present-surgery is planned    Current Outpatient Medications on File Prior to Visit  Medication Sig Dispense Refill   amitriptyline (ELAVIL) 10 MG tablet Take 1 tablet (10 mg total) by mouth at bedtime. May increase to 2 tablets (20mg ) at bedtime (Patient not taking: Reported on 10/14/2017) 60 tablet 3   Cholecalciferol (VITAMIN D) 2000 units tablet Take 2,000 Units by mouth daily.     Multiple Vitamin (MULTIVITAMIN WITH MINERALS) TABS tablet Take 1 tablet by mouth daily.     Vitamin D, Ergocalciferol, (DRISDOL) 50000 units CAPS capsule      No current facility-administered medications on file prior to visit.    Past Surgical History:  Procedure Laterality Date   ABDOMINAL HYSTERECTOMY     CYSTOSCOPY WITH RETROGRADE PYELOGRAM, URETEROSCOPY AND STENT PLACEMENT Right 07/31/2015   Procedure: CYSTOSCOPY WITH RETROGRADE PYELOGRAM, URETEROSCOPY AND STENT PLACEMENT;  Surgeon: Heloise Purpura, MD;  Location: [facility];  Service: Urology;  Laterality: Right;   HOLMIUM LASER APPLICATION Right 07/31/2015   Procedure: HOLMIUM LASER APPLICATION;  Surgeon: Heloise Purpura, MD;  Location: [facility];  Service: Urology;  Laterality: Right;   MASTECTOMY      with immediate reconstruction bilaterally due to positive gene factor    Allergies  Allergen Reactions   Vancomycin Rash    Severe diffuse rash after Vancomycin that required steroids   Penicillin G Hives    BP 122/86   Ht 5\' 6"  (1.676 m)   Wt 195 lb (88.5 kg)   LMP 01/27/2015   BMI 31.47 kg/m       No data to display              No data to display              Objective:  Physical Exam:  Gen: NAD, comfortable in exam room  Left knee: VMO atrophy.  Healed lateral scar.  No other gross deformity, ecchymoses, swelling. Minimal TTP post patellar facets. FROM with normal strength. Negative ant/post drawers. Negative valgus/varus testing. Negative lachman.  Negative mcmurrays, apleys. NV intact distally.   Assessment & Plan:  1. Left knee pain - consistent with patellofemoral syndrome.  Start physical therapy, home exercises.  Icing, tylenol, celecoxib.  F/u in 6 weeks.

## 2023-06-11 ENCOUNTER — Ambulatory Visit (INDEPENDENT_AMBULATORY_CARE_PROVIDER_SITE_OTHER): Payer: 59 | Admitting: Family Medicine

## 2023-06-11 VITALS — BP 112/78 | Ht 66.0 in | Wt 190.0 lb

## 2023-06-11 DIAGNOSIS — M222X2 Patellofemoral disorders, left knee: Secondary | ICD-10-CM

## 2023-06-11 NOTE — Patient Instructions (Signed)
Continue your physical therapy. Add the VMO strengthening exercise to your home exercise program on days you don't go to therapy. Follow up with me in about 6 weeks.

## 2023-06-12 NOTE — Progress Notes (Signed)
PCP: Gaspar Garbe, MD  Subjective:   HPI: Patient is a 52 y.o. female here for left knee pain.  10/16: Patient reports back in March (3/31) she was in a skiing accident She sustained a tibial plateau fracture left knee requiring ORIF with plate and screws. This healed completely. She did extensive physical therapy and been swimming. She was seen by Dr. Luiz Blare in August and had injection due to catching anterior left knee just deep to patella and didn't notice much benefit. They discussed possible arthroscopy. Pain worse with stairs. Had MRI though a lot of artifact due to metal.  She did not have meniscus tear and cruciates were intact.  Edema within med post patella and medial femoral condyle.  11/21: Patient reports she has improved since last visit. Still trouble trying to go down stairs and doing single legged squat. No new injuries. Pain still anterior.  Past Medical History:  Diagnosis Date   Right kidney stone    at present-surgery is planned    Current Outpatient Medications on File Prior to Visit  Medication Sig Dispense Refill   amitriptyline (ELAVIL) 10 MG tablet Take 1 tablet (10 mg total) by mouth at bedtime. May increase to 2 tablets (20mg ) at bedtime (Patient not taking: Reported on 10/14/2017) 60 tablet 3   Cholecalciferol (VITAMIN D) 2000 units tablet Take 2,000 Units by mouth daily.     Multiple Vitamin (MULTIVITAMIN WITH MINERALS) TABS tablet Take 1 tablet by mouth daily.     Vitamin D, Ergocalciferol, (DRISDOL) 50000 units CAPS capsule      No current facility-administered medications on file prior to visit.    Past Surgical History:  Procedure Laterality Date   ABDOMINAL HYSTERECTOMY     CYSTOSCOPY WITH RETROGRADE PYELOGRAM, URETEROSCOPY AND STENT PLACEMENT Right 07/31/2015   Procedure: CYSTOSCOPY WITH RETROGRADE PYELOGRAM, URETEROSCOPY AND STENT PLACEMENT;  Surgeon: Heloise Purpura, MD;  Location: [facility];  Service: Urology;  Laterality: Right;    HOLMIUM LASER APPLICATION Right 07/31/2015   Procedure: HOLMIUM LASER APPLICATION;  Surgeon: Heloise Purpura, MD;  Location: [facility];  Service: Urology;  Laterality: Right;   MASTECTOMY     with immediate reconstruction bilaterally due to positive gene factor    Allergies  Allergen Reactions   Vancomycin Rash    Severe diffuse rash after Vancomycin that required steroids   Penicillin G Hives    BP 112/78   Ht 5\' 6"  (1.676 m)   Wt 190 lb (86.2 kg)   LMP 01/27/2015   BMI 30.67 kg/m       No data to display              No data to display              Objective:  Physical Exam:  Gen: NAD, comfortable in exam room  Left knee: VMO atrophy.  Healed lateral scar.  No other gross deformity, ecchymoses, swelling. No TTP currently. FROM with normal strength. Negative ant/post drawers. Negative valgus/varus testing. Negative lachman.  Negative mcmurrays, apleys.  NV intact distally.   Assessment & Plan:  1. Left knee pain - consistent with patellofemoral syndrome.  Improving with physical therapy - continue with this.  Additional VMO strengthening reviewed today.  Tylenol, celecoxib.  F/u in 6 weeks.

## 2023-07-31 ENCOUNTER — Ambulatory Visit: Payer: 59 | Admitting: Family Medicine

## 2023-07-31 ENCOUNTER — Encounter: Payer: Self-pay | Admitting: Family Medicine

## 2023-07-31 VITALS — BP 126/84 | Ht 66.0 in | Wt 200.0 lb

## 2023-07-31 DIAGNOSIS — M222X2 Patellofemoral disorders, left knee: Secondary | ICD-10-CM | POA: Diagnosis not present

## 2023-07-31 NOTE — Progress Notes (Signed)
 PCP: Tisovec, Richard W, MD  Subjective:   HPI: Patient is a 53 y.o. female here for left knee pain.  10/16: Patient reports back in March (3/31) she was in a skiing accident She sustained a tibial plateau fracture left knee requiring ORIF with plate and screws. This healed completely. She did extensive physical therapy and been swimming. She was seen by Dr. Yvone in August and had injection due to catching anterior left knee just deep to patella and didn't notice much benefit. They discussed possible arthroscopy. Pain worse with stairs. Had MRI though a lot of artifact due to metal.  She did not have meniscus tear and cruciates were intact.  Edema within med post patella and medial femoral condyle.  11/21: Patient reports she has improved since last visit. Still trouble trying to go down stairs and doing single legged squat. No new injuries. Pain still anterior.  07/31/23: Patient reports she continues to improve. Doing physical therapy and home exercises. Again notes trouble with the single leg squat but stairs better.  Past Medical History:  Diagnosis Date   Right kidney stone    at present-surgery is planned    Current Outpatient Medications on File Prior to Visit  Medication Sig Dispense Refill   amitriptyline  (ELAVIL ) 10 MG tablet Take 1 tablet (10 mg total) by mouth at bedtime. May increase to 2 tablets (20mg ) at bedtime (Patient not taking: Reported on 10/14/2017) 60 tablet 3   Cholecalciferol (VITAMIN D) 2000 units tablet Take 2,000 Units by mouth daily.     Multiple Vitamin (MULTIVITAMIN WITH MINERALS) TABS tablet Take 1 tablet by mouth daily.     Vitamin D, Ergocalciferol, (DRISDOL) 50000 units CAPS capsule      No current facility-administered medications on file prior to visit.    Past Surgical History:  Procedure Laterality Date   ABDOMINAL HYSTERECTOMY     CYSTOSCOPY WITH RETROGRADE PYELOGRAM, URETEROSCOPY AND STENT PLACEMENT Right 07/31/2015   Procedure:  CYSTOSCOPY WITH RETROGRADE PYELOGRAM, URETEROSCOPY AND STENT PLACEMENT;  Surgeon: Gretel Ferrara, MD;  Location: [facility];  Service: Urology;  Laterality: Right;   HOLMIUM LASER APPLICATION Right 07/31/2015   Procedure: HOLMIUM LASER APPLICATION;  Surgeon: Gretel Ferrara, MD;  Location: [facility];  Service: Urology;  Laterality: Right;   MASTECTOMY     with immediate reconstruction bilaterally due to positive gene factor    Allergies  Allergen Reactions   Vancomycin Rash    Severe diffuse rash after Vancomycin that required steroids   Penicillin G Hives    BP 126/84   Ht 5' 6 (1.676 m)   Wt 200 lb (90.7 kg)   LMP 01/27/2015   BMI 32.28 kg/m       No data to display              No data to display              Objective:  Physical Exam:  Gen: NAD, comfortable in exam room  Left knee: Mild VMO atrophy.  Healed lateral scar.  No other deformity. No TTP. FROM with normal strength. Negative ant/post drawers. Negative valgus/varus testing. Negative lachman. Negative mcmurrays, apleys. NV intact distally.   Assessment & Plan:  1. Left knee pain - 2/2 patellofemoral syndrome.  Improving.  Continue physical therapy and home exercises.  Tylenol , ibuprofen if needed.  F/u prn.

## 2023-10-15 ENCOUNTER — Ambulatory Visit: Admitting: Family Medicine

## 2023-10-15 ENCOUNTER — Encounter: Payer: Self-pay | Admitting: Family Medicine

## 2023-10-15 VITALS — BP 118/82 | Ht 66.0 in | Wt 190.0 lb

## 2023-10-15 DIAGNOSIS — M542 Cervicalgia: Secondary | ICD-10-CM

## 2023-10-15 NOTE — Progress Notes (Signed)
 PCP: Gaspar Garbe, MD  Subjective:   HPI: Patient is a 53 y.o. female here for neck pain.  Patient reports developing neck pain about 4-5 months ago. No injury or trauma prior to this. Pain worse with turning head each direction. Sometimes her hands are numb in the morning but resolves with straightening her arms.  Past Medical History:  Diagnosis Date   Right kidney stone    at present-surgery is planned    Current Outpatient Medications on File Prior to Visit  Medication Sig Dispense Refill   amitriptyline (ELAVIL) 10 MG tablet Take 1 tablet (10 mg total) by mouth at bedtime. May increase to 2 tablets (20mg ) at bedtime (Patient not taking: Reported on 10/14/2017) 60 tablet 3   Cholecalciferol (VITAMIN D) 2000 units tablet Take 2,000 Units by mouth daily.     Multiple Vitamin (MULTIVITAMIN WITH MINERALS) TABS tablet Take 1 tablet by mouth daily.     Vitamin D, Ergocalciferol, (DRISDOL) 50000 units CAPS capsule      No current facility-administered medications on file prior to visit.    Past Surgical History:  Procedure Laterality Date   ABDOMINAL HYSTERECTOMY     CYSTOSCOPY WITH RETROGRADE PYELOGRAM, URETEROSCOPY AND STENT PLACEMENT Right 07/31/2015   Procedure: CYSTOSCOPY WITH RETROGRADE PYELOGRAM, URETEROSCOPY AND STENT PLACEMENT;  Surgeon: Heloise Purpura, MD;  Location: [facility];  Service: Urology;  Laterality: Right;   HOLMIUM LASER APPLICATION Right 07/31/2015   Procedure: HOLMIUM LASER APPLICATION;  Surgeon: Heloise Purpura, MD;  Location: [facility];  Service: Urology;  Laterality: Right;   MASTECTOMY     with immediate reconstruction bilaterally due to positive gene factor    Allergies  Allergen Reactions   Vancomycin Rash    Severe diffuse rash after Vancomycin that required steroids   Penicillin G Hives    BP 118/82   Ht 5\' 6"  (1.676 m)   Wt 190 lb (86.2 kg)   LMP 01/27/2015   BMI 30.67 kg/m       No data to display              No data to display               Objective:  Physical Exam:  Gen: NAD, comfortable in exam room  Neck: No gross deformity, swelling, bruising. TTP mildly bilateral paraspinal cervical regions, left > right trapezius.  No midline/bony TTP. FROM with pain on lateral rotations. BUE strength 5/5.   Sensation intact to light touch.   2+ equal reflexes in triceps, biceps, brachioradialis tendons. Negative spurlings. NV intact distal BUEs.   Assessment & Plan:  1. Neck pain - consistent with cervical and trapezius strains.  Home exercises reviewed.  Heat, anti-inflammatories as needed.  Ergonomic issues reviewed.  Consider formal physical therapy if struggling.  Follow up in 6 weeks.

## 2023-10-15 NOTE — Patient Instructions (Addendum)
 You have cervical and trapezius strains. Do home exercises daily as directed. Heat 15 minutes at a time 3-4 times a day as needed. Anti-inflammatories can help with pain but usually don't make you get better faster at this point. Make sure neck is straight when sleeping, try to have screens at eye level as much as possible. Consider formal physical therapy if you're struggling. Follow up in 6 weeks.

## 2023-11-26 ENCOUNTER — Ambulatory Visit: Admitting: Family Medicine

## 2023-12-10 ENCOUNTER — Ambulatory Visit: Admitting: Family Medicine

## 2024-01-07 ENCOUNTER — Ambulatory Visit: Admitting: Sports Medicine

## 2024-01-07 VITALS — BP 148/86 | Ht 66.0 in | Wt 195.0 lb

## 2024-01-07 DIAGNOSIS — M79672 Pain in left foot: Secondary | ICD-10-CM

## 2024-01-07 NOTE — Progress Notes (Signed)
   PCP: No primary care provider on file.  SUBJECTIVE:   HPI:  Patient is a 53 y.o. female here with chief complaint of acute left foot pain.  Pain has been present for the past 2 weeks. Denies inciting injury, but did start after water rehab and doing lot of leg kicks. She is one year s/p left tibial plateau fracture and is still in PT for this. Thinks her starting to increase her activity has set off the pain in her foot. Does endorse some swelling along the dorsum of the foot and this is where most her pain has been. Has been icing and doing naproxen with improvement. Today however he pain is not bothering her. Does have upcoming trip overseas.  Pertinent ROS were reviewed with the patient and found to be negative unless otherwise specified above in HPI.   PERTINENT  PMH / PSH / FH / SH:  Past Medical, Surgical, Social, and Family History Reviewed & Updated in the EMR.  Pertinent findings include:  Left tibial plateau fracture s/p ORIF  Allergies  Allergen Reactions   Vancomycin Rash    Severe diffuse rash after Vancomycin that required steroids   Penicillin G Hives    OBJECTIVE:  BP (!) 148/86   Ht 5' 6 (1.676 m)   Wt 195 lb (88.5 kg)   LMP 01/27/2015   BMI 31.47 kg/m   PHYSICAL EXAM:  GEN: Alert and Oriented, NAD, comfortable in exam room RESP: Unlabored respirations, symmetric chest rise PSY: normal mood, congruent affect   Left Foot and Ankle Exam: No visible erythema. Small focal swelling over the dorsum of the foot at the extensor digitorum. Preserved arches.  ROM is full in all directions Strength is 5/5 in all directions without pain. No pain with resisted dorsifelxion or toe extension. No reproducible TTP along medial/lateral malleoli, talar dome, anterior tibialis, extensor hallucis longus, extensor digitorum, tarsals or metatarsals.   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 Exam today benign aside from some mild dorsal swelling.   Plan; - Continue Naproxen BID x2 weeks then PRN - Ice and compression socks - Relative rest reviewed from repetitive dorsiflexion exercises - Recommend follow-up in 3-4 weeks and can consider U/S evaluation if pain returns/persists. Patient's questions were answered and they are in agreement with this plan.   Lin Rend, MD PGY-4, Sports Medicine Fellow [facility]

## 2024-07-26 ENCOUNTER — Other Ambulatory Visit: Payer: Self-pay

## 2024-07-26 ENCOUNTER — Ambulatory Visit: Admitting: Family Medicine

## 2024-07-26 VITALS — BP 110/80 | Ht 66.0 in | Wt 190.0 lb

## 2024-07-26 DIAGNOSIS — S8992XA Unspecified injury of left lower leg, initial encounter: Secondary | ICD-10-CM

## 2024-07-26 DIAGNOSIS — M79672 Pain in left foot: Secondary | ICD-10-CM

## 2024-07-26 NOTE — Patient Instructions (Signed)
 You have a flexor hallucis tendinitis/tenosynovitis. Ice 15 minutes at a time 3-4 times a day. Aleve 2 tabs twice a day with food - consider taking for 7 days then as needed. Postop shoe or cam walker when up and walking around for next 4 weeks. Ok to come out of this to sleep, ice, bathe, and use elliptical/bike. Use pain as your guide for other activities. Consider physical therapy, inserts in the future with a 1st ray post.

## 2024-07-27 NOTE — Progress Notes (Signed)
 PCP: Tisovec, Richard W, MD  Discussed the use of AI scribe software for clinical note transcription with the patient, who gave verbal consent to proceed.  History of Present Illness Aimee Mcdaniel is a 54 year old female with prior left knee injury who presents with left foot pain following a recent skiing accident.  Left foot pain - Onset during a three-week trip to [place] after a skiing accident on approximately December 12th - Progressively worsening, exquisitely painful at the base of the forefoot with ambulation, now involving the entire foot - No pain with toe flexion or extension - Able to touch toes and fully extend leg without discomfort - Previously had discomfort with toe dorsiflexion after the injury, which has resolved - High daily step counts and up to twelve hours per day on hard tile floors caring for grandchildren during symptom progression  Left lower extremity trauma - Sustained lateral impact to the left calf near the top of the boot from another skier while skiing on approximately December 12th - No ecchymosis - Able to continue skiing after the incident - Pain with knee extension and pulling sensation in the posterior leg with forward flexion immediately after injury - Radiographs at orthopedist's office showed no fracture  Left knee injury history - Completed 18 months of physical therapy for left knee, finishing in October - Asymptomatic at discharge from physical therapy - Began working with a systems analyst and regularly walked over 10,000 steps per day without symptoms around Thanksgiving  Past Medical History:  Diagnosis Date   Right kidney stone    at present-surgery is planned    Medications Ordered Prior to Encounter[1]  Past Surgical History:  Procedure Laterality Date   ABDOMINAL HYSTERECTOMY     CYSTOSCOPY WITH RETROGRADE PYELOGRAM, URETEROSCOPY AND STENT PLACEMENT Right 07/31/2015   Procedure: CYSTOSCOPY WITH RETROGRADE PYELOGRAM,  URETEROSCOPY AND STENT PLACEMENT;  Surgeon: Gretel Ferrara, MD;  Location: [facility];  Service: Urology;  Laterality: Right;   HOLMIUM LASER APPLICATION Right 07/31/2015   Procedure: HOLMIUM LASER APPLICATION;  Surgeon: Gretel Ferrara, MD;  Location: [facility];  Service: Urology;  Laterality: Right;   MASTECTOMY     with immediate reconstruction bilaterally due to positive gene factor    Allergies[2]  BP 110/80   Ht 5' 6 (1.676 m)   Wt 190 lb (86.2 kg)   LMP 01/27/2015   BMI 30.67 kg/m       No data to display              No data to display              Objective:  Physical Exam:  Gen: NAD, comfortable in exam room  Left knee: No gross deformity, ecchymoses, swelling. No TTP currently including hamstrings, calf, joint lines. FROM with normal strength. Negative ant/post drawers. Negative valgus/varus testing. Negative lachman.  Negative mcmurrays, apleys.  NV intact distally.  Left foot/ankle: No gross deformity, swelling, ecchymoses Full range of motion with pain on passive dorsiflexion of 1st digit No tenderness to palpation 1st MTP or sesamoids.  Tenderness more proximal to this plantar foot about 1st flexor hallucis longus tendon Negative ant drawer and negative talar tilt.   Negative metatarsal squeeze NV intact distally.  Limited MSK u/s left foot:  No irregularities, cortical defects, edema of sesamoids or 1st metatarsal.  Increased fluid within flexor hallucis longus tendon but no tears visible. Assessment & Plan Left foot flexor hallucis longus tendinopathy/strain Progressive pain localized to the flexor  hallucis longus tendon post-skiing accident. Not consistent with stress fracture or sesamoiditis.   - Cam walker or postop shoe (took both today) - icing - aleve - use pain as guide for activities but would avoid barefoot walking, swimming, walking/jogging out of boot for now - follow up in 4 weeks - consider inserts with 1st ray post in future, physical  therapy  History of left knee injury Remote injury with recent trauma. Examination showed no ligamentous or meniscal injury.     [1]  Current Outpatient Medications on File Prior to Visit  Medication Sig Dispense Refill   amitriptyline  (ELAVIL ) 10 MG tablet Take 1 tablet (10 mg total) by mouth at bedtime. May increase to 2 tablets (20mg ) at bedtime (Patient not taking: Reported on 10/14/2017) 60 tablet 3   Cholecalciferol (VITAMIN D) 2000 units tablet Take 2,000 Units by mouth daily.     Multiple Vitamin (MULTIVITAMIN WITH MINERALS) TABS tablet Take 1 tablet by mouth daily.     Vitamin D, Ergocalciferol, (DRISDOL) 50000 units CAPS capsule      No current facility-administered medications on file prior to visit.  [2]  Allergies Allergen Reactions   Vancomycin Rash    Severe diffuse rash after Vancomycin that required steroids   Penicillin G Hives

## 2024-08-18 ENCOUNTER — Ambulatory Visit: Admitting: Family Medicine

## 2024-08-18 VITALS — BP 126/82 | Ht 66.0 in | Wt 190.0 lb

## 2024-08-18 DIAGNOSIS — M79672 Pain in left foot: Secondary | ICD-10-CM

## 2024-08-19 NOTE — Progress Notes (Signed)
 PCP: Tisovec, Richard W, MD  Patient is a 54 y.o. female here for left foot injury.  HPI 1/5: Left foot pain - Onset during a three-week trip to [place] after a skiing accident on approximately December 12th - Progressively worsening, exquisitely painful at the base of the forefoot with ambulation, now involving the entire foot - No pain with toe flexion or extension - Able to touch toes and fully extend leg without discomfort - Previously had discomfort with toe dorsiflexion after the injury, which has resolved - High daily step counts and up to twelve hours per day on hard tile floors caring for grandchildren during symptom progression  Left lower extremity trauma - Sustained lateral impact to the left calf near the top of the boot from another skier while skiing on approximately December 12th - No ecchymosis - Able to continue skiing after the incident - Pain with knee extension and pulling sensation in the posterior leg with forward flexion immediately after injury - Radiographs at orthopedist's office showed no fracture  Left knee injury history - Completed 18 months of physical therapy for left knee, finishing in October - Asymptomatic at discharge from physical therapy - Began working with a systems analyst and regularly walked over 10,000 steps per day without symptoms around Thanksgiving  1/28: Patient is doing much better. Wore the boot for about 3 weeks. Takes celebrex which has helped also. Does not feel quite back to 100% - has questions about going skiing this weekend on a trip.  Past Medical History:  Diagnosis Date   Right kidney stone    at present-surgery is planned    Medications Ordered Prior to Encounter[1]  Past Surgical History:  Procedure Laterality Date   ABDOMINAL HYSTERECTOMY     CYSTOSCOPY WITH RETROGRADE PYELOGRAM, URETEROSCOPY AND STENT PLACEMENT Right 07/31/2015   Procedure: CYSTOSCOPY WITH RETROGRADE PYELOGRAM, URETEROSCOPY AND STENT  PLACEMENT;  Surgeon: Gretel Ferrara, MD;  Location: [facility];  Service: Urology;  Laterality: Right;   HOLMIUM LASER APPLICATION Right 07/31/2015   Procedure: HOLMIUM LASER APPLICATION;  Surgeon: Gretel Ferrara, MD;  Location: [facility];  Service: Urology;  Laterality: Right;   MASTECTOMY     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 FROM digits. No tenderness to palpation currently. NVI distally.  Assessment and Plan:  Left foot injury - much improved with cam walker for 3 weeks to rest flexor hallucis tendinopathy/strain.  Exam reassuring.  Icing or aleve only if needed.  No restrictions on activity.  Discussed do not think she needs orthotics with first ray post at this time given her significant improvement.  Follow up as needed.    [1]  Current Outpatient Medications on File Prior to Visit  Medication Sig Dispense Refill   amitriptyline  (ELAVIL ) 10 MG tablet Take 1 tablet (10 mg total) by mouth at bedtime. May increase to 2 tablets (20mg ) at bedtime (Patient not taking: Reported on 10/14/2017) 60 tablet 3   Cholecalciferol (VITAMIN D) 2000  units tablet Take 2,000 Units by mouth daily.     Multiple Vitamin (MULTIVITAMIN WITH MINERALS) TABS tablet Take 1 tablet by mouth daily.     Vitamin D, Ergocalciferol, (DRISDOL) 50000 units CAPS capsule      No current facility-administered medications on file prior to visit.  [2]  Allergies Allergen Reactions   Vancomycin Rash    Severe diffuse rash after Vancomycin that required steroids   Penicillin G Hives

## 2024-08-23 ENCOUNTER — Ambulatory Visit: Admitting: Family Medicine
